# Patient Record
Sex: Male | Born: 1995 | Race: White | Hispanic: No | Marital: Single | Smoking: Current every day smoker
Health system: Southern US, Community
[De-identification: ages and names within clinical notes are randomized; demographics above are authoritative.]

## PROBLEM LIST (undated history)

## (undated) DIAGNOSIS — S83519A Sprain of anterior cruciate ligament of unspecified knee, initial encounter: Secondary | ICD-10-CM

## (undated) DIAGNOSIS — M25562 Pain in left knee: Secondary | ICD-10-CM

## (undated) DIAGNOSIS — J45909 Unspecified asthma, uncomplicated: Secondary | ICD-10-CM

---
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
## 2016-04-03 ENCOUNTER — Encounter (HOSPITAL_COMMUNITY): Payer: Self-pay | Admitting: Emergency Medicine

## 2016-04-03 ENCOUNTER — Ambulatory Visit (HOSPITAL_COMMUNITY)
Admission: EM | Admit: 2016-04-03 | Discharge: 2016-04-03 | Disposition: A | Discharge status: Home / Self Care | Payer: Self-pay | Attending: Emergency Medicine | Admitting: Emergency Medicine

## 2016-04-03 DIAGNOSIS — G44209 Tension-type headache, unspecified, not intractable: Secondary | ICD-10-CM

## 2016-04-03 MED ORDER — CYCLOBENZAPRINE HCL 10 MG PO TABS: 10.0000 mg | ORAL_TABLET | Freq: Every day | ORAL | 0 refills | Status: DC

## 2016-04-03 MED ORDER — NAPROXEN 500 MG PO TABS: 500.0000 mg | ORAL_TABLET | Freq: Two times a day (BID) | ORAL | 0 refills | Status: DC

## 2016-04-03 NOTE — ED Provider Notes (Signed)
CSN: [id number]     Arrival date & time 04/03/16  1007 History   First MD Initiated Contact with Patient 04/03/16 1030     No chief complaint on file.  (Consider location/radiation/quality/duration/timing/severity/associated sxs/prior Treatment) Patient c/o headache bilateral with band restriction qualities.  He states the headache has been present for over 2 weeks now. The headache was worse 5 days ago when he was suffering from viral illness.  He has improved from the viral illness but the headache has been persistent and he had to call out of work today. He has taken  advil on occasion and it has helped some.   The history is provided by the patient.  Headache  Pain location:  Frontal, L parietal, R parietal, L temporal, R temporal and occipital Quality:  Dull Radiates to:  Does not radiate Severity currently:  5/10 Onset quality:  Sudden Duration:  2 weeks Timing:  Constant Progression:  Worsening Chronicity:  New Similar to prior headaches: yes   Context: activity   Relieved by:  NSAIDs Worsened by:  Activity Ineffective treatments:  NSAIDs   Past Medical History:  Diagnosis Date  . Asthma    No past surgical history on file. No family history on file. Social History  Substance Use Topics  . Smoking status: Never Smoker  . Smokeless tobacco: Not on file  . Alcohol use Not on file    Review of Systems  Constitutional: Negative.   Eyes: Negative.   Respiratory: Negative.   Cardiovascular: Negative.   Gastrointestinal: Negative.   Endocrine: Negative.   Musculoskeletal: Negative.   Skin: Negative.   Allergic/Immunologic: Negative.   Neurological: Positive for headaches.  Psychiatric/Behavioral: Negative.     Allergies  Codeine  Home Medications   Prior to Admission medications   Medication Sig Start Date End Date Taking? Authorizing Provider  albuterol (PROVENTIL HFA;VENTOLIN HFA) 108 (90 BASE) MCG/ACT inhaler Inhale 2 puffs into the lungs every 6  (six) hours as needed for wheezing or shortness of breath.    Historical Provider, MD  cetirizine (ZYRTEC) 10 MG tablet Take 1 tablet (10 mg total) by mouth daily. 10/31/14   Charm RingsErin J Honig, MD  cyclobenzaprine (FLEXERIL) 10 MG tablet Take 1 tablet (10 mg total) by mouth at bedtime. 04/03/16   Deatra CanterWilliam J Oxford, FNP  ibuprofen (ADVIL,MOTRIN) 200 MG tablet Take 600 mg by mouth every 6 (six) hours as needed for pain.    Historical Provider, MD  naproxen (NAPROSYN) 500 MG tablet Take 1 tablet (500 mg total) by mouth 2 (two) times daily. 11/15/13   Vanetta MuldersScott Zackowski, MD  naproxen (NAPROSYN) 500 MG tablet Take 1 tablet (500 mg total) by mouth 2 (two) times daily with a meal. 04/03/16   Deatra CanterWilliam J Oxford, FNP  traMADol (ULTRAM) 50 MG tablet Take 1 tablet (50 mg total) by mouth every 6 (six) hours as needed. 11/15/13   Vanetta MuldersScott Zackowski, MD   Meds Ordered and Administered this Visit  Medications - No data to display  There were no vitals taken for this visit. No data found.   Physical Exam  Constitutional: He appears well-developed and well-nourished.  HENT:  Head: Normocephalic and atraumatic.  Right Ear: External ear normal.  Left Ear: External ear normal.  Nose: Nose normal.  Mouth/Throat: Oropharynx is clear and moist.  Eyes: Conjunctivae and EOM are normal. Pupils are equal, round, and reactive to light.  Neck: Normal range of motion. Neck supple.  Pulmonary/Chest: Effort normal and breath sounds normal.  Abdominal: Soft.  Bowel sounds are normal.  Musculoskeletal: He exhibits tenderness.  TTP frontal, occipital, and parietal.  Neck supple no nuchal rigidity.  Nursing note and vitals reviewed.   Urgent Care Course   Clinical Course    Procedures (including critical care time)  Labs Review Labs Reviewed - No data to display  Imaging Review No results found.   Visual Acuity Review  Right Eye Distance:   Left Eye Distance:   Bilateral Distance:    Right Eye Near:   Left Eye Near:     Bilateral Near:         MDM   1. Tension headache    Naprosyn 500mg  one po bid x 10 days #20 Flexeril 10mg  po qhs prn #10    Deatra Canter, FNP 04/03/16 1058

## 2016-04-03 NOTE — ED Triage Notes (Signed)
Patient reports headache and fever for 2 weeks.  Last fever was Saturday.

## 2016-04-04 ENCOUNTER — Encounter (HOSPITAL_COMMUNITY): Payer: Self-pay | Admitting: Emergency Medicine

## 2016-04-04 ENCOUNTER — Ambulatory Visit (HOSPITAL_COMMUNITY)
Admission: EM | Admit: 2016-04-04 | Discharge: 2016-04-04 | Disposition: A | Discharge status: Home / Self Care | Payer: Self-pay | Attending: Internal Medicine | Admitting: Internal Medicine

## 2016-04-04 DIAGNOSIS — S0501XA Injury of conjunctiva and corneal abrasion without foreign body, right eye, initial encounter: Secondary | ICD-10-CM

## 2016-04-04 MED ORDER — POLYMYXIN B-TRIMETHOPRIM 10000-0.1 UNIT/ML-% OP SOLN: 1.0000 [drp] | OPHTHALMIC | 0 refills | Status: DC

## 2016-04-04 MED ORDER — EYE WASH OPHTH SOLN
OPHTHALMIC | Status: AC
  Filled 2016-04-04: qty 118

## 2016-04-04 MED ORDER — TETRACAINE HCL 0.5 % OP SOLN
OPHTHALMIC | Status: AC
  Filled 2016-04-04: qty 2

## 2016-04-04 NOTE — ED Notes (Addendum)
Notified Jay Rasmussenavid Mabe, NP of patient and complaint
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
## 2017-01-15 ENCOUNTER — Emergency Department (HOSPITAL_COMMUNITY)
Admission: EM | Admit: 2017-01-15 | Discharge: 2017-01-15 | Disposition: A | Discharge status: Home / Self Care | Payer: Self-pay | Attending: Emergency Medicine | Admitting: Emergency Medicine

## 2017-01-15 ENCOUNTER — Emergency Department (HOSPITAL_COMMUNITY): Payer: Self-pay

## 2017-01-15 ENCOUNTER — Encounter (HOSPITAL_COMMUNITY): Payer: Self-pay | Admitting: Emergency Medicine

## 2017-01-15 DIAGNOSIS — Y92310 Basketball court as the place of occurrence of the external cause: Secondary | ICD-10-CM | POA: Insufficient documentation

## 2017-01-15 DIAGNOSIS — J45909 Unspecified asthma, uncomplicated: Secondary | ICD-10-CM | POA: Insufficient documentation

## 2017-01-15 DIAGNOSIS — Y9367 Activity, basketball: Secondary | ICD-10-CM | POA: Insufficient documentation

## 2017-01-15 DIAGNOSIS — X501XXA Overexertion from prolonged static or awkward postures, initial encounter: Secondary | ICD-10-CM | POA: Insufficient documentation

## 2017-01-15 DIAGNOSIS — M25462 Effusion, left knee: Secondary | ICD-10-CM | POA: Insufficient documentation

## 2017-01-15 DIAGNOSIS — S8392XA Sprain of unspecified site of left knee, initial encounter: Secondary | ICD-10-CM | POA: Insufficient documentation

## 2017-01-15 DIAGNOSIS — Z79899 Other long term (current) drug therapy: Secondary | ICD-10-CM | POA: Insufficient documentation

## 2017-01-15 DIAGNOSIS — M25562 Pain in left knee: Secondary | ICD-10-CM | POA: Insufficient documentation

## 2017-01-15 DIAGNOSIS — F17201 Nicotine dependence, unspecified, in remission: Secondary | ICD-10-CM | POA: Insufficient documentation

## 2017-01-15 DIAGNOSIS — Y999 Unspecified external cause status: Secondary | ICD-10-CM | POA: Insufficient documentation

## 2017-01-15 MED ORDER — IBUPROFEN 800 MG PO TABS
800.0000 mg | ORAL_TABLET | Freq: Once | ORAL | Status: AC
  Administered 2017-01-15: 800 mg via ORAL
  Filled 2017-01-15: qty 1

## 2017-01-15 NOTE — ED Triage Notes (Signed)
Pt c/o popping sensation in l/knee while playing basketball last night. Tx with ice and tylenol. Able to bear some weight with crutches

## 2017-01-15 NOTE — Discharge Instructions (Signed)
Wear knee immobilizer at all times until you see the orthopedist. You may take it off to shower but don't put any weight through your left leg until you've been cleared by the orthopedist. Use crutches for all weight bearing activities. Ice and elevate knee throughout the day, using ice pack for no more than 20 minutes every hour. Alternate between tylenol and motrin as needed for pain relief. Call orthopedic follow up today or tomorrow to schedule followup appointment for recheck in 5-7 days, and for ongoing evaluation of your knee injury. Return to the ER for changes or worsening symptoms.

## 2017-01-15 NOTE — ED Provider Notes (Signed)
[facility]-EMERGENCY DEPT Provider Note   CSN: [id number] Arrival date & time: 01/15/17  1101  By signing my name below, I, Vista Mink, attest that this documentation has been prepared under the direction and in the presence of [employer].  Electronically Signed: Vista Mink, ED Scribe. 01/15/17. 12:12 PM.  History   Chief Complaint Chief Complaint  Patient presents with  . Knee Pain    l/knee   HPI HPI Comments: Jay Haas is a 21 y.o. male who presents to the Emergency Department complaining of left knee pain s/p an injury that occurred last night around 1930. Pt was playing basketball and believes he may have gotten his left leg tangled between another players, but he is unsure of the exact mechanism of injury, and thinks he may have twisted it, which caused him to fall down onto the side of his knee. Pt describes the pain as 7/10, constant, throbbing, non-radiating, L knee pain that is exacerbated when bearing weight, relieved slightly with ice/elevation/rest, and unrelieved with Tylenol. Associated symptoms include swelling. No prior injuries or surgeries to the knee. He does not see an orthopedist. Pt denies bruising, redness, warmth, numbness, tingling, focal weakness, head inj/LOC, or any other complaints at this time.    The history is provided by the patient and medical records. No language interpreter was used.  Knee Pain   This is a new problem. The current episode started 12 to 24 hours ago. The problem occurs constantly. The problem has been gradually worsening. The pain is present in the left knee. The quality of the pain is described as constant. The pain is at a severity of 7/10. The pain is moderate. Pertinent negatives include no numbness and no tingling. The symptoms are aggravated by standing and activity. He has tried rest, OTC pain medications and cold for the symptoms. The treatment provided mild relief. There has been no history of extremity trauma.     Past Medical History:  Diagnosis Date  . Asthma     There are no active problems to display for this patient.   History reviewed. No pertinent surgical history.     Home Medications    Prior to Admission medications   Medication Sig Start Date End Date Taking? Authorizing Provider  albuterol (PROVENTIL HFA;VENTOLIN HFA) 108 (90 BASE) MCG/ACT inhaler Inhale 2 puffs into the lungs every 6 (six) hours as needed for wheezing or shortness of breath.    [provider]  cetirizine (ZYRTEC) 10 MG tablet Take 1 tablet (10 mg total) by mouth daily. 10/31/14   Charm Rings, MD  cyclobenzaprine (FLEXERIL) 10 MG tablet Take 1 tablet (10 mg total) by mouth at bedtime. 04/03/16   Deatra Canter, FNP  ibuprofen (ADVIL,MOTRIN) 200 MG tablet Take 600 mg by mouth every 6 (six) hours as needed for pain.    [provider]  naproxen (NAPROSYN) 500 MG tablet Take 1 tablet (500 mg total) by mouth 2 (two) times daily. 11/15/13   Vanetta Mulders, MD  naproxen (NAPROSYN) 500 MG tablet Take 1 tablet (500 mg total) by mouth 2 (two) times daily with a meal. 04/03/16   Oxford, Anselm Pancoast, FNP  traMADol (ULTRAM) 50 MG tablet Take 1 tablet (50 mg total) by mouth every 6 (six) hours as needed. 11/15/13   Vanetta Mulders, MD  trimethoprim-polymyxin b (POLYTRIM) ophthalmic solution Place 1 drop into the right eye every 4 (four) hours. For 4 days 04/04/16   Hayden Rasmussen, NP   Family  History Family History  Problem Relation Age of Onset  . Hypertension Mother   . Hypertension Father     Social History Social History  Substance Use Topics  . Smoking status: Current Every Day Smoker    Packs/day: 0.50    Types: Cigarettes  . Smokeless tobacco: Never Used  . Alcohol use Yes     Comment: occ   Allergies   Codeine  Review of Systems Review of Systems  HENT: Negative for facial swelling (no head inj).   Musculoskeletal: Positive for arthralgias (left knee) and joint swelling (left knee).  Negative for myalgias.  Skin: Negative for color change.  Allergic/Immunologic: Negative for immunocompromised state.  Neurological: Negative for tingling, syncope, weakness and numbness.  Psychiatric/Behavioral: Negative for confusion.    Physical Exam Updated Vital Signs BP (!) 136/92 (BP Location: Right Arm)   Pulse 60   Temp 98.1 F (36.7 C) (Oral)   Resp 18   Ht 5\' 8"  (1.727 m)   Wt 215 lb (97.5 kg)   SpO2 99%   BMI 32.69 kg/m   Physical Exam  Constitutional: He is oriented to person, place, and time. Vital signs are normal. He appears well-developed and well-nourished.  Non-toxic appearance. No distress.  Afebrile, nontoxic, NAD  HENT:  Head: Normocephalic and atraumatic.  Mouth/Throat: Mucous membranes are normal.  Eyes: Conjunctivae and EOM are normal. Right eye exhibits no discharge. Left eye exhibits no discharge.  Neck: Normal range of motion. Neck supple.  Cardiovascular: Normal rate and intact distal pulses.   Pulmonary/Chest: Effort normal. No respiratory distress.  Abdominal: Normal appearance. He exhibits no distension.  Musculoskeletal: Normal range of motion.       Left knee: He exhibits swelling and effusion. He exhibits normal range of motion, no ecchymosis, no deformity, no laceration, no erythema, normal alignment, no LCL laxity, normal patellar mobility and no MCL laxity. Tenderness found. Medial joint line and lateral joint line tenderness noted.  Left knee with FROM intact, with diffuse jointline TTP, +swelling/effusion, no deformity, no bruising or erythema, no warmth, no abnormal alignment or patellar mobility, no varus/valgus laxity, neg anterior drawer test, no crepitus. No baker's cyst palpable however significant tenderness to popliteal fossa. Strength and sensation grossly intact, distal pulses intact, compartments soft. Unable to bear much weight through the L leg, can only put ~5-10% through that leg  Neurological: He is alert and oriented to  person, place, and time. He has normal strength. No sensory deficit.  Skin: Skin is warm, dry and intact. No rash noted.  Psychiatric: He has a normal mood and affect.  Nursing note and vitals reviewed.  ED Treatments / Results  DIAGNOSTIC STUDIES: Oxygen Saturation is 99% on RA, normal by my interpretation.  COORDINATION OF CARE: 12:05 PM-Discussed treatment plan with pt at bedside and pt agreed to plan.   Labs (all labs ordered are listed, but only abnormal results are displayed) Labs Reviewed - No data to display  EKG  EKG Interpretation None       Radiology Dg Knee Complete 4 Views Left  Result Date: 01/15/2017 CLINICAL DATA:  Injured playing basketball yesterday with persistent pain, initial encounter EXAM: LEFT KNEE - COMPLETE 4+ VIEW COMPARISON:  None. FINDINGS: No acute fracture or dislocation is seen. Moderate joint effusion is noted. No other soft tissue abnormality is seen. IMPRESSION: Moderate joint effusion without acute bony abnormality. Electronically Signed   By: Alcide CleverMark  Lukens M.D.   On: 01/15/2017 12:07    Procedures Procedures (including critical  care time)  Medications Ordered in ED Medications  ibuprofen (ADVIL,MOTRIN) tablet 800 mg (800 mg Oral Given 01/15/17 1217)     Initial Impression / Assessment and Plan / ED Course  I have reviewed the triage vital signs and the nursing notes.  Pertinent labs & imaging results that were available during my care of the patient were reviewed by me and considered in my medical decision making (see chart for details).     21 y.o. male here with L knee pain/swelling after twisting it during basketball yesterday and having difficulty bearing weight since then. On exam, moderate swelling/effusion, no bruising/erythema/warmth, diffuse jointline TTP and to popliteal fossa, unable to put more than ~5-10% of weight through the leg. Xray ordered, will await results, however if negative, may consider CT knee given possible  concern for tibial plateau fx. Will give ibuprofen at pt's request, then reassess shortly.   12:26 PM Xray negative however showing moderate joint effusion. Discussed with pt that we could either get CT to confirm or r/o occult tibial plateau fx, or we could treat conservatively and have him f/up with ortho who would most likely end up getting MRI of the knee anyway, which would show any occult fx AND meniscal/ligamentous injuries. Discussed r/b/a of both options, and pt ultimately decided he'd rather know for certain if there's an occult osseous injury, so he opted for CT today. Will obtain this, and reassess after.   2:05 PM Pt doesn't want to wait any longer for CT scan, and would prefer now to just leave and f/up with ortho. I think this is reasonable considering he's NVI although discussed that there may be an occult tibial plateau fx that we haven't diagnosed. Will place in knee immobilizer and give crutches, advised nonweight bearing until he sees the orthopedist, will have him f/up with ortho in 5-7 days for ongoing evaluation and management of his injury. Discussed RICE, tylenol/motrin for pain.  I explained the diagnosis and have given explicit precautions to return to the ER including for any other new or worsening symptoms. The patient understands and accepts the medical plan as it's been dictated and I have answered their questions. Discharge instructions concerning home care and prescriptions have been given. The patient is STABLE and is discharged to home in good condition.   I personally performed the services described in this documentation, which was scribed in my presence. The recorded information has been reviewed and is accurate.    Final Clinical Impressions(s) / ED Diagnoses   Final diagnoses:  Acute pain of left knee  Sprain of left knee, unspecified ligament, initial encounter  Effusion of left knee    New Prescriptions New Prescriptions   No medications on [address] [place], [place] 01/15/17 1406    Arby Barrette, MD 01/16/17 1622

## 2017-01-20 ENCOUNTER — Encounter (INDEPENDENT_AMBULATORY_CARE_PROVIDER_SITE_OTHER): Payer: Self-pay | Admitting: Orthopaedic Surgery

## 2017-01-20 ENCOUNTER — Ambulatory Visit (INDEPENDENT_AMBULATORY_CARE_PROVIDER_SITE_OTHER): Payer: Self-pay | Admitting: Orthopaedic Surgery

## 2017-01-20 DIAGNOSIS — M1612 Unilateral primary osteoarthritis, left hip: Secondary | ICD-10-CM

## 2017-01-20 DIAGNOSIS — M25562 Pain in left knee: Secondary | ICD-10-CM

## 2017-01-20 NOTE — Progress Notes (Signed)
   Office Visit Note   Patient: Jay Haas           Date of Birth: Apr 19, 1996           MRN: [id number] Visit Date: 01/20/2017              Requested by: No referring provider defined for this encounter. PCP: Patient, No Pcp Per   Assessment & Plan: Visit Diagnoses:  1. Acute pain of left knee   2. Primary osteoarthritis of left hip     Plan: MRI of the left knee ordered to evaluate for large effusion, occult fracture and internal derangement.  Follow-Up Instructions: Return in about 10 days (around 01/30/2017).   Orders:  Orders Placed This Encounter  Procedures  . MR Knee Left w/o contrast   No orders of the defined types were placed in this encounter.     Procedures: No procedures performed   Clinical Data: No additional findings.   Subjective: Chief Complaint  Patient presents with  . Left Knee - Injury    Jay Haas is a 21 year old healthy gentleman who sustained a left knee injury while playing basketball 6 days ago. He felt something tear in his knee and immediately fell to the floor. He now has swelling in his knee. He was evaluated in the ER and x-rays were normal except for an effusion. He has been ambulating in a knee immobilizer. He denies any radiation of pain.    Review of Systems  Constitutional: Negative.   All other systems reviewed and are negative.    Objective: Vital Signs: There were no vitals taken for this visit.  Physical Exam  Constitutional: He is oriented to person, place, and time. He appears well-developed and well-nourished.  HENT:  Head: Normocephalic and atraumatic.  Eyes: Pupils are equal, round, and reactive to light.  Neck: Neck supple.  Pulmonary/Chest: Effort normal.  Abdominal: Soft.  Musculoskeletal: Normal range of motion.  Neurological: He is alert and oriented to person, place, and time.  Skin: Skin is warm.  Psychiatric: He has a normal mood and affect. His behavior is normal. Judgment and thought content  normal.  Nursing note and vitals reviewed.   Ortho Exam Left knee exam shows a moderate effusion. There is no warmth or cellulitis. Range of motion is appropriate. Collaterals and cruciates are stable. Patellar tracking is normal. He does have significant lateral joint line tenderness and lateral tibial plateau pain with palpation. Specialty Comments:  No specialty comments available.  Imaging: No results found.   PMFS History: There are no active problems to display for this patient.  Past Medical History:  Diagnosis Date  . Asthma     Family History  Problem Relation Age of Onset  . Hypertension Mother   . Hypertension Father     No past surgical history on file. Social History   Occupational History  . Not on file.   Social History Main Topics  . Smoking status: Current Every Day Smoker    Packs/day: 0.50    Types: Cigarettes  . Smokeless tobacco: Never Used  . Alcohol use Yes     Comment: occ  . Drug use: Yes    Types: Marijuana  . Sexual activity: Not on file

## 2017-01-30 ENCOUNTER — Ambulatory Visit (INDEPENDENT_AMBULATORY_CARE_PROVIDER_SITE_OTHER): Payer: Self-pay | Admitting: Orthopaedic Surgery

## 2017-02-03 ENCOUNTER — Ambulatory Visit
Admission: RE | Admit: 2017-02-03 | Discharge: 2017-02-03 | Disposition: A | Discharge status: Home / Self Care | Payer: No Typology Code available for payment source | Source: Ambulatory Visit | Attending: Orthopaedic Surgery | Admitting: Orthopaedic Surgery

## 2017-02-03 DIAGNOSIS — M25562 Pain in left knee: Secondary | ICD-10-CM

## 2017-02-07 ENCOUNTER — Ambulatory Visit (INDEPENDENT_AMBULATORY_CARE_PROVIDER_SITE_OTHER): Payer: Self-pay | Admitting: Orthopaedic Surgery

## 2017-02-07 ENCOUNTER — Other Ambulatory Visit (INDEPENDENT_AMBULATORY_CARE_PROVIDER_SITE_OTHER): Payer: Self-pay

## 2017-02-07 DIAGNOSIS — S83512A Sprain of anterior cruciate ligament of left knee, initial encounter: Secondary | ICD-10-CM

## 2017-02-07 DIAGNOSIS — S83512D Sprain of anterior cruciate ligament of left knee, subsequent encounter: Secondary | ICD-10-CM

## 2017-02-07 NOTE — Progress Notes (Signed)
   Office Visit Note   Patient: Jay Haas           Date of Birth: 1995/08/28           MRN: [id number] Visit Date: 02/07/2017              Requested by: No referring provider defined for this encounter. PCP: Patient, No Pcp Per   Assessment & Plan: Visit Diagnoses:  1. New ACL tear, left, initial encounter     Plan: MRI findings were reviewed with the patient. Patient like to attempt conservative treatment with functional anterior cruciate ligament brace and physical therapy. I'll like to put him on restrictions at work for 6 weeks. Questions encouraged and answered. Follow-up with me as needed.  Follow-Up Instructions: Return if symptoms worsen or fail to improve.   Orders:  No orders of the defined types were placed in this encounter.  No orders of the defined types were placed in this encounter.     Procedures: No procedures performed   Clinical Data: No additional findings.   Subjective: No chief complaint on file.   Jay Haas comes in today to review his MRI. His knee feels slightly better. His injury was about 3 weeks ago. He does still have a little persistent swelling.    Review of Systems  Constitutional: Negative.   All other systems reviewed and are negative.    Objective: Vital Signs: There were no vitals taken for this visit.  Physical Exam  Constitutional: He is oriented to person, place, and time. He appears well-developed and well-nourished.  Pulmonary/Chest: Effort normal.  Abdominal: Soft.  Neurological: He is alert and oriented to person, place, and time.  Skin: Skin is warm.  Psychiatric: He has a normal mood and affect. His behavior is normal. Judgment and thought content normal.  Nursing note and vitals reviewed.   Ortho Exam Left knee exam shows a trace effusion. Lachman and anterior drawer are both positive. Negative pivot shift. Specialty Comments:  No specialty comments available.  Imaging: No results found.   PMFS  History: Patient Active Problem List   Diagnosis Date Noted  . New ACL tear, left, initial encounter 02/07/2017   Past Medical History:  Diagnosis Date  . Asthma     Family History  Problem Relation Age of Onset  . Hypertension Mother   . Hypertension Father     No past surgical history on file. Social History   Occupational History  . Not on file.   Social History Main Topics  . Smoking status: Current Every Day Smoker    Packs/day: 0.50    Types: Cigarettes  . Smokeless tobacco: Never Used  . Alcohol use Yes     Comment: occ  . Drug use: Yes    Types: Marijuana  . Sexual activity: Not on file

## 2017-07-22 ENCOUNTER — Emergency Department (HOSPITAL_COMMUNITY)
Admission: EM | Admit: 2017-07-22 | Discharge: 2017-07-22 | Disposition: A | Discharge status: Home / Self Care | Payer: Self-pay | Attending: Emergency Medicine | Admitting: Emergency Medicine

## 2017-07-22 ENCOUNTER — Other Ambulatory Visit: Payer: Self-pay

## 2017-07-22 ENCOUNTER — Encounter (HOSPITAL_COMMUNITY): Payer: Self-pay | Admitting: Emergency Medicine

## 2017-07-22 ENCOUNTER — Emergency Department (HOSPITAL_COMMUNITY): Payer: Self-pay

## 2017-07-22 DIAGNOSIS — F17210 Nicotine dependence, cigarettes, uncomplicated: Secondary | ICD-10-CM | POA: Insufficient documentation

## 2017-07-22 DIAGNOSIS — Y929 Unspecified place or not applicable: Secondary | ICD-10-CM | POA: Insufficient documentation

## 2017-07-22 DIAGNOSIS — J45909 Unspecified asthma, uncomplicated: Secondary | ICD-10-CM | POA: Insufficient documentation

## 2017-07-22 DIAGNOSIS — W1842XA Slipping, tripping and stumbling without falling due to stepping into hole or opening, initial encounter: Secondary | ICD-10-CM | POA: Insufficient documentation

## 2017-07-22 DIAGNOSIS — Y9389 Activity, other specified: Secondary | ICD-10-CM | POA: Insufficient documentation

## 2017-07-22 DIAGNOSIS — Y998 Other external cause status: Secondary | ICD-10-CM | POA: Insufficient documentation

## 2017-07-22 DIAGNOSIS — S8392XA Sprain of unspecified site of left knee, initial encounter: Secondary | ICD-10-CM | POA: Insufficient documentation

## 2017-07-22 DIAGNOSIS — Z79899 Other long term (current) drug therapy: Secondary | ICD-10-CM | POA: Insufficient documentation

## 2017-07-22 HISTORY — DX: Sprain of anterior cruciate ligament of unspecified knee, initial encounter: S83.519A

## 2017-07-22 HISTORY — DX: Pain in left knee: M25.562

## 2017-07-22 MED ORDER — HYDROCODONE-ACETAMINOPHEN 5-325 MG PO TABS: ORAL_TABLET | ORAL | 0 refills | Status: DC

## 2017-07-22 NOTE — Discharge Instructions (Signed)
Keep the knee immobilizer on at all times including when sleeping except when bathing. Do not stop using the knee immobilizer until you are cleared by your orthopedist.  For pain control please take ibuprofen (also known as Motrin or Advil) 800mg  (this is normally 4 over the counter pills) 3 times a day  for 5 days. Take with food to minimize stomach irritation.  Take vicodin for breakthrough pain, do not drink alcohol, drive, care for children or do other critical tasks while taking vicodin.  Please follow with your primary care doctor in the next 2 days for a check-up. They must obtain records for further management.   Do not hesitate to return to the Emergency Department for any new, worsening or concerning symptoms.

## 2017-07-22 NOTE — ED Provider Notes (Signed)
[facility]-EMERGENCY DEPT Provider Note   CSN: [id number] Arrival date & time: 07/22/17  1022     History   Chief Complaint Chief Complaint  Patient presents with  . Knee Pain     HPI   Blood pressure (!) 160/89, pulse 78, temperature 98 F (36.7 C), resp. rate 16, weight 102.1 kg (225 lb), SpO2 99 %.  Jay Haas is a 21 y.o. male complaining of severe left knee pain after stepping in a hole while walking and twisting it yesterday.  He had a partial ACL tear of the same knee approximately 6 months ago.  He has been using his crutches, he has a knee immobilizer at home.  He has not been using the immobilizer.  Has been taking ibuprofen at home with little relief.  The pain is severe and exacerbated by movement, palpation, extension, weightbearing.  Past Medical History:  Diagnosis Date  . ACL (anterior cruciate ligament) tear   . Asthma   . Knee pain, left    ACL tear    Patient Active Problem List   Diagnosis Date Noted  . New ACL tear, left, initial encounter 02/07/2017    History reviewed. No pertinent surgical history.     Home Medications    Prior to Admission medications   Medication Sig Start Date End Date Taking? Authorizing Provider  albuterol (PROVENTIL HFA;VENTOLIN HFA) 108 (90 BASE) MCG/ACT inhaler Inhale 2 puffs into the lungs every 6 (six) hours as needed for wheezing or shortness of breath.    [provider]  cetirizine (ZYRTEC) 10 MG tablet Take 1 tablet (10 mg total) by mouth daily. Patient not taking: Reported on 01/15/2017 10/31/14   Charm RingsHonig, Erin J, MD  cyclobenzaprine (FLEXERIL) 10 MG tablet Take 1 tablet (10 mg total) by mouth at bedtime. Patient not taking: Reported on 01/15/2017 04/03/16   Deatra Canterxford, William J, FNP  fexofenadine (ALLEGRA) 180 MG tablet Take 180 mg by mouth daily.    [provider]  HYDROcodone-acetaminophen (NORCO/VICODIN) 5-325 MG tablet Take 1-2 tablets by mouth every 6 hours as needed  for pain. 07/22/17   Kimie Pidcock, Joni ReiningNicole, PA-C  ibuprofen (ADVIL,MOTRIN) 200 MG tablet Take 600 mg by mouth every 6 (six) hours as needed for pain.    [provider]  naproxen (NAPROSYN) 500 MG tablet Take 1 tablet (500 mg total) by mouth 2 (two) times daily. Patient not taking: Reported on 01/15/2017 11/15/13   Vanetta MuldersZackowski, Scott, MD  naproxen (NAPROSYN) 500 MG tablet Take 1 tablet (500 mg total) by mouth 2 (two) times daily with a meal. Patient not taking: Reported on 01/15/2017 04/03/16   Deatra Canterxford, William J, FNP  traMADol (ULTRAM) 50 MG tablet Take 1 tablet (50 mg total) by mouth every 6 (six) hours as needed. Patient not taking: Reported on 01/15/2017 11/15/13   Vanetta MuldersZackowski, Scott, MD  trimethoprim-polymyxin b (POLYTRIM) ophthalmic solution Place 1 drop into the right eye every 4 (four) hours. For 4 days Patient not taking: Reported on 01/15/2017 04/04/16   Hayden RasmussenMabe, David, NP    Family History Family History  Problem Relation Age of Onset  . Hypertension Mother   . Hypertension Father     Social History Social History   Tobacco Use  . Smoking status: Current Every Day Smoker    Packs/day: 0.50    Types: Cigarettes  . Smokeless tobacco: Never Used  Substance Use Topics  . Alcohol use: Yes    Comment: occ  . Drug use: Yes  Types: Marijuana     Allergies   Codeine   Review of Systems Review of Systems  A complete review of systems was obtained and all systems are negative except as noted in the HPI and PMH. =  Physical Exam Updated Vital Signs BP (!) 160/89   Pulse 78   Temp 98 F (36.7 C)   Resp 16   Wt 102.1 kg (225 lb)   SpO2 99%   BMI 34.21 kg/m   Physical Exam  Constitutional: He is oriented to person, place, and time. He appears well-developed and well-nourished. No distress.  HENT:  Head: Normocephalic.  Eyes: Conjunctivae and EOM are normal.  Cardiovascular: Normal rate.  Pulmonary/Chest: Effort normal. No stridor.  Musculoskeletal:  Left knee with  no overlying erythema, laceration or abrasions.  Moderate effusion, significantly reduced range of motion in extension.  Grossly stable to valgus and varus stress, anterior and posterior drawer grossly stable.  Distally neurovascularly intact.  Neurological: He is alert and oriented to person, place, and time.  Psychiatric: He has a normal mood and affect.  Nursing note and vitals reviewed.    ED Treatments / Results  Labs (all labs ordered are listed, but only abnormal results are displayed) Labs Reviewed - No data to display  EKG  EKG Interpretation None       Radiology Dg Knee Complete 4 Views Left  Result Date: 07/22/2017 CLINICAL DATA:  21 year old male with history of knee injury known prior ACL injury EXAM: LEFT KNEE - COMPLETE 4+ VIEW COMPARISON:  MR 02/03/2017, plain film 01/15/2017 FINDINGS: No acute displaced fracture identified. No significant degenerative changes. No radiopaque foreign body. Lateral view demonstrates joint effusion. IMPRESSION: Left knee joint effusion with no acute bony abnormality. Electronically Signed   By: Gilmer Mor D.O.   On: 07/22/2017 11:53    Procedures Procedures (including critical care time)  Medications Ordered in ED Medications - No data to display   Initial Impression / Assessment and Plan / ED Course  I have reviewed the triage vital signs and the nursing notes.  Pertinent labs & imaging results that were available during my care of the patient were reviewed by me and considered in my medical decision making (see chart for details).     Vitals:   07/22/17 1036 07/22/17 1113 07/22/17 1219  BP: (!) 160/89    Pulse: 90  78  Resp: 16    Temp: 98 F (36.7 C)    SpO2: 97%  99%  Weight:  102.1 kg (225 lb)     Medications - No data to display  Jay Haas is 21 y.o. male presenting with left knee pain and swelling after stepping into a hole and twisting it yesterday.  He has had partial ACL tear on this knee  approximately 6 months ago.  Grossly stable on my exam however, there is significant inflammation and effusion.  He has some difficulty with full extension of the knee.  X-ray negative, I have advised him to use his knee immobilizer at all times, he has crutches sized appropriately.  I advised high-dose ibuprofen and Vicodin with close follow-up with orthopedist, patient is written out of work.  He declines pain medication in the ED.  Evaluation does not show pathology that would require ongoing emergent intervention or inpatient treatment. Pt is hemodynamically stable and mentating appropriately. Discussed findings and plan with patient/guardian, who agrees with care plan. All questions answered. Return precautions discussed and outpatient follow up given.  Final Clinical Impressions(s) / ED Diagnoses   Final diagnoses:  Sprain of left knee, unspecified ligament, initial encounter    ED Discharge Orders        Ordered    HYDROcodone-acetaminophen (NORCO/VICODIN) 5-325 MG tablet     07/22/17 1212       Dariel Pellecchia, Mardella Laymanicole, PA-C 07/22/17 1255    Mancel BaleWentz, Elliott, MD 07/22/17 [phone]

## 2017-07-22 NOTE — ED Triage Notes (Signed)
Pt stated the he re injured his l/knee.Pt twisted l/knee last night.Stated that he  heard a loud popping sound. Pt has hx of l/knee injury 6 months ago.

## 2017-07-31 ENCOUNTER — Ambulatory Visit (INDEPENDENT_AMBULATORY_CARE_PROVIDER_SITE_OTHER): Payer: Self-pay | Admitting: Orthopaedic Surgery

## 2017-08-04 ENCOUNTER — Encounter (INDEPENDENT_AMBULATORY_CARE_PROVIDER_SITE_OTHER): Payer: Self-pay | Admitting: Orthopaedic Surgery

## 2017-08-04 ENCOUNTER — Ambulatory Visit (INDEPENDENT_AMBULATORY_CARE_PROVIDER_SITE_OTHER): Payer: Self-pay | Admitting: Orthopaedic Surgery

## 2017-08-04 DIAGNOSIS — S83512A Sprain of anterior cruciate ligament of left knee, initial encounter: Secondary | ICD-10-CM

## 2017-08-04 MED ORDER — METHYLPREDNISOLONE ACETATE 40 MG/ML IJ SUSP
40.0000 mg | INTRAMUSCULAR | Status: AC | PRN
  Administered 2017-08-04: 40 mg via INTRA_ARTICULAR

## 2017-08-04 MED ORDER — LIDOCAINE HCL 1 % IJ SOLN
2.0000 mL | INTRAMUSCULAR | Status: AC | PRN
  Administered 2017-08-04: 2 mL

## 2017-08-04 MED ORDER — BUPIVACAINE HCL 0.5 % IJ SOLN
2.0000 mL | INTRAMUSCULAR | Status: AC | PRN
  Administered 2017-08-04: 2 mL via INTRA_ARTICULAR

## 2017-08-04 NOTE — Progress Notes (Signed)
   Office Visit Note   Patient: Jay Haas           Date of Birth: 05-10-1996           MRN: [id number] Visit Date: 08/04/2017              Requested by: No referring provider defined for this encounter. PCP: Patient, No Pcp Per   Assessment & Plan: Visit Diagnoses:  1. New ACL tear, left, initial encounter     Plan: Impression is 21 year old gentleman with recurrent left knee effusion status post known ACL tear.  Aspiration and injection were performed today.  Patient understands this may be a recurrent issue due to his ACL tear.  He understands that recommendation is for ACL reconstruction but patient currently is uninsured and is unable to afford surgery.  Follow-Up Instructions: Return if symptoms worsen or fail to improve.   Orders:  No orders of the defined types were placed in this encounter.  No orders of the defined types were placed in this encounter.     Procedures: Large Joint Inj: L knee on 08/04/2017 5:54 PM Details: 22 G needle Medications: 2 mL bupivacaine 0.5 %; 2 mL lidocaine 1 %; 40 mg methylPREDNISolone acetate 40 MG/ML Aspirate: blood-tinged Outcome: tolerated well, no immediate complications Patient was prepped and draped in the usual sterile fashion.       Clinical Data: No additional findings.   Subjective: Chief Complaint  Patient presents with  . Left Knee - Pain    Patient follows up today for recent left knee pain and swelling.  He twisted his knee recently and had development of swelling.  His pain is mild to moderate.  He has trouble extending his knee.  He did not feel a pop.    Review of Systems  Constitutional: Negative.   All other systems reviewed and are negative.    Objective: Vital Signs: There were no vitals taken for this visit.  Physical Exam  Constitutional: He is oriented to person, place, and time. He appears well-developed and well-nourished.  Pulmonary/Chest: Effort normal.  Abdominal: Soft.    Neurological: He is alert and oriented to person, place, and time.  Skin: Skin is warm.  Psychiatric: He has a normal mood and affect. His behavior is normal. Judgment and thought content normal.  Nursing note and vitals reviewed.   Ortho Exam Left knee exam shows a large effusion.  Otherwise exam is stable Specialty Comments:  No specialty comments available.  Imaging: No results found.   PMFS History: Patient Active Problem List   Diagnosis Date Noted  . New ACL tear, left, initial encounter 02/07/2017   Past Medical History:  Diagnosis Date  . ACL (anterior cruciate ligament) tear   . Asthma   . Knee pain, left    ACL tear    Family History  Problem Relation Age of Onset  . Hypertension Mother   . Hypertension Father     History reviewed. No pertinent surgical history. Social History   Occupational History  . Not on file  Tobacco Use  . Smoking status: Current Every Day Smoker    Packs/day: 0.50    Types: Cigarettes  . Smokeless tobacco: Never Used  Substance and Sexual Activity  . Alcohol use: Yes    Comment: occ  . Drug use: Yes    Types: Marijuana  . Sexual activity: Not on file

## 2017-12-19 ENCOUNTER — Telehealth (INDEPENDENT_AMBULATORY_CARE_PROVIDER_SITE_OTHER): Payer: Self-pay | Admitting: Orthopaedic Surgery

## 2017-12-19 NOTE — Telephone Encounter (Signed)
Mailed medical records request form to patient

## 2018-10-29 ENCOUNTER — Other Ambulatory Visit: Payer: Self-pay

## 2018-10-29 ENCOUNTER — Encounter (HOSPITAL_COMMUNITY): Payer: Self-pay | Admitting: Emergency Medicine

## 2018-10-29 ENCOUNTER — Emergency Department (HOSPITAL_COMMUNITY)
Admission: EM | Admit: 2018-10-29 | Discharge: 2018-10-29 | Disposition: A | Discharge status: Home / Self Care | Payer: Self-pay | Attending: Emergency Medicine | Admitting: Emergency Medicine

## 2018-10-29 DIAGNOSIS — F17210 Nicotine dependence, cigarettes, uncomplicated: Secondary | ICD-10-CM | POA: Insufficient documentation

## 2018-10-29 DIAGNOSIS — Z79899 Other long term (current) drug therapy: Secondary | ICD-10-CM | POA: Insufficient documentation

## 2018-10-29 DIAGNOSIS — J45909 Unspecified asthma, uncomplicated: Secondary | ICD-10-CM | POA: Insufficient documentation

## 2018-10-29 DIAGNOSIS — B370 Candidal stomatitis: Secondary | ICD-10-CM | POA: Insufficient documentation

## 2018-10-29 LAB — CBG MONITORING, ED: Glucose-Capillary: 113 mg/dL — ABNORMAL HIGH (ref 70–99)

## 2018-10-29 MED ORDER — NYSTATIN 100000 UNIT/ML MT SUSP: 500000.0000 [IU] | Freq: Four times a day (QID) | OROMUCOSAL | 0 refills | Status: AC

## 2018-10-29 NOTE — ED Triage Notes (Signed)
Pt reports white splotches and soreness in his throat x 2 days,

## 2018-10-29 NOTE — ED Notes (Signed)
Discharge instructions and prescription discussed with Pt. Pt verbalized understanding. Pt stable and ambulatory.    

## 2018-10-29 NOTE — ED Provider Notes (Signed)
[facility] EMERGENCY DEPARTMENT Provider Note   CSN: [id number] Arrival date & time: 10/29/18  2147    History   Chief Complaint Chief Complaint  Patient presents with  . Sore Throat    HPI Jay Haas is a 23 y.o. male.     23 y/o male with no PMH presents to the emergency department with a sore throat x2 days.  He reports feeling a tickle in his throat when he looked at his throat tonight and states seeing white patches all along the middle.  He also reports a dry cough, reports not much of a URI symptoms lately.  No alleviating or exacerbating factors.  He reports no sick contacts.  Patient denies any children in the home.  He denies any difficulty swallowing, fever, shortness of breath.     Past Medical History:  Diagnosis Date  . ACL (anterior cruciate ligament) tear   . Asthma   . Knee pain, left    ACL tear    Patient Active Problem List   Diagnosis Date Noted  . New ACL tear, left, initial encounter 02/07/2017    History reviewed. No pertinent surgical history.      Home Medications    Prior to Admission medications   Medication Sig Start Date End Date Taking? Authorizing Provider  albuterol (PROVENTIL HFA;VENTOLIN HFA) 108 (90 BASE) MCG/ACT inhaler Inhale 2 puffs into the lungs every 6 (six) hours as needed for wheezing or shortness of breath.    [provider]  cetirizine (ZYRTEC) 10 MG tablet Take 1 tablet (10 mg total) by mouth daily. Patient not taking: Reported on 01/15/2017 10/31/14   Charm Rings, MD  cyclobenzaprine (FLEXERIL) 10 MG tablet Take 1 tablet (10 mg total) by mouth at bedtime. Patient not taking: Reported on 01/15/2017 04/03/16   Deatra Canter, FNP  fexofenadine (ALLEGRA) 180 MG tablet Take 180 mg by mouth daily.    [provider]  HYDROcodone-acetaminophen (NORCO/VICODIN) 5-325 MG tablet Take 1-2 tablets by mouth every 6 hours as needed for pain. 07/22/17   Pisciotta, Joni Reining, PA-C  ibuprofen  (ADVIL,MOTRIN) 200 MG tablet Take 600 mg by mouth every 6 (six) hours as needed for pain.    [provider]  naproxen (NAPROSYN) 500 MG tablet Take 1 tablet (500 mg total) by mouth 2 (two) times daily. Patient not taking: Reported on 01/15/2017 11/15/13   Vanetta Mulders, MD  naproxen (NAPROSYN) 500 MG tablet Take 1 tablet (500 mg total) by mouth 2 (two) times daily with a meal. Patient not taking: Reported on 01/15/2017 04/03/16   Deatra Canter, FNP  nystatin (MYCOSTATIN) 100000 UNIT/ML suspension Take 5 mLs (500,000 Units total) by mouth 4 (four) times daily for 5 days. retained in mouth as long as possible prior to swallowing. 10/29/18 11/03/18  Claude Manges, PA-C  traMADol (ULTRAM) 50 MG tablet Take 1 tablet (50 mg total) by mouth every 6 (six) hours as needed. Patient not taking: Reported on 01/15/2017 11/15/13   Vanetta Mulders, MD  trimethoprim-polymyxin b (POLYTRIM) ophthalmic solution Place 1 drop into the right eye every 4 (four) hours. For 4 days Patient not taking: Reported on 01/15/2017 04/04/16   Hayden Rasmussen, NP    Family History Family History  Problem Relation Age of Onset  . Hypertension Mother   . Hypertension Father     Social History Social History   Tobacco Use  . Smoking status: Current Every Day Smoker    Packs/day: 0.50  Types: Cigarettes  . Smokeless tobacco: Never Used  Substance Use Topics  . Alcohol use: Yes    Comment: occ  . Drug use: Yes    Types: Marijuana     Allergies   Codeine   Review of Systems Review of Systems  Constitutional: Negative for fever.  HENT: Positive for sore throat.      Physical Exam Updated Vital Signs BP (!) 157/95   Pulse 85   Temp 98.1 F (36.7 C) (Oral)   Resp 16   SpO2 99%   Physical Exam Vitals signs and nursing note reviewed.  Constitutional:      Appearance: He is well-developed.     Comments: Well-appearing.  HENT:     Head: Normocephalic and atraumatic.     Mouth/Throat:     Mouth:  Mucous membranes are moist.     Pharynx: Posterior oropharyngeal erythema present. No oropharyngeal exudate.     Comments: White patches located along the uvula and palate region.  No tonsillar exudate, tonsillar abscess noted. Eyes:     General: No scleral icterus.    Pupils: Pupils are equal, round, and reactive to light.  Neck:     Musculoskeletal: Normal range of motion.  Cardiovascular:     Heart sounds: Normal heart sounds.  Pulmonary:     Effort: Pulmonary effort is normal.     Breath sounds: Normal breath sounds. No wheezing.     Comments: Lungs are clear to auscultation, no wheezing or rales noted. Chest:     Chest wall: No tenderness.  Abdominal:     General: Bowel sounds are normal. There is no distension.     Palpations: Abdomen is soft.     Tenderness: There is no abdominal tenderness.  Musculoskeletal:        General: No tenderness or deformity.  Skin:    General: Skin is warm and dry.  Neurological:     Mental Status: He is alert and oriented to person, place, and time.      ED Treatments / Results  Labs (all labs ordered are listed, but only abnormal results are displayed) Labs Reviewed  CBG MONITORING, ED - Abnormal; Notable for the following components:      Result Value   Glucose-Capillary 113 (*)    All other components within normal limits    EKG None  Radiology No results found.  Procedures Procedures (including critical care time)  Medications Ordered in ED Medications - No data to display   Initial Impression / Assessment and Plan / ED Course  I have reviewed the triage vital signs and the nursing notes.  Pertinent labs & imaging results that were available during my care of the patient were reviewed by me and considered in my medical decision making (see chart for details).      Patient presents with chief complaint of sore throat, no past medical history aside from asthma.  Reports no increase using his inhaler recently.  Denies  any URI symptoms aside from sore throat.  No congestion or sick contacts in the home.  During evaluation there are oral patches along the uvula midline and palate, no tonsillar exudates or tonsillar abscess noted.  He denies any fever, Centor criteria is negative, low suspicion for strep pharyngitis as patient does not have any of the other listed symptoms.  Vital signs are stable during visit, denies any shortness of breath at this time.  No visual tonsillar abscess during evaluation.  No difficulty tolerating his secretions or  swallowing.  Oral patches are consistent likely with oral candidiasis, he is currently not immunocompromised per patient.  Denies any previous history of HIV, diabetes at this time.  CBG was within normal limits.  Will give patient a prescription for nystatin, instructions have been provided. Patient instructed to follow-up with PCP as needed.  Patient understands and agrees with management.  Return precautions provided at length.   Final Clinical Impressions(s) / ED Diagnoses   Final diagnoses:  Oral candidiasis    ED Discharge Orders         Ordered    nystatin (MYCOSTATIN) 100000 UNIT/ML suspension  4 times daily     10/29/18 2209           Claude Manges, Cordelia Poche 10/29/18 2218    Mesner, Barbara Cower, MD 11/02/18 [phone]

## 2018-10-29 NOTE — Discharge Instructions (Addendum)
I have prescribed medication to help with your sore throat, please gargle this medication in your mouth and spit it out. You may do this up to 4 times a day. Please follow up with  your primary care for further evaluation if needed.

## 2018-10-30 ENCOUNTER — Telehealth: Payer: Self-pay | Admitting: *Deleted

## 2018-10-30 NOTE — Telephone Encounter (Signed)
Pharmacy called related to Rx: Nystatin  .Marland KitchenMarland KitchenEDCM clarified with EDP to change Rx to: .

## 2019-03-28 IMAGING — CR DG KNEE COMPLETE 4+V*L*
4 series · 4 of 4 positions shown · non-contrast
Comparison: MR 02/03/2017, plain film 01/15/2017

CLINICAL DATA: 21-year-old male with history of knee injury known
prior ACL injury

EXAM:
LEFT KNEE - COMPLETE 4+ VIEW

[t knee ap left]
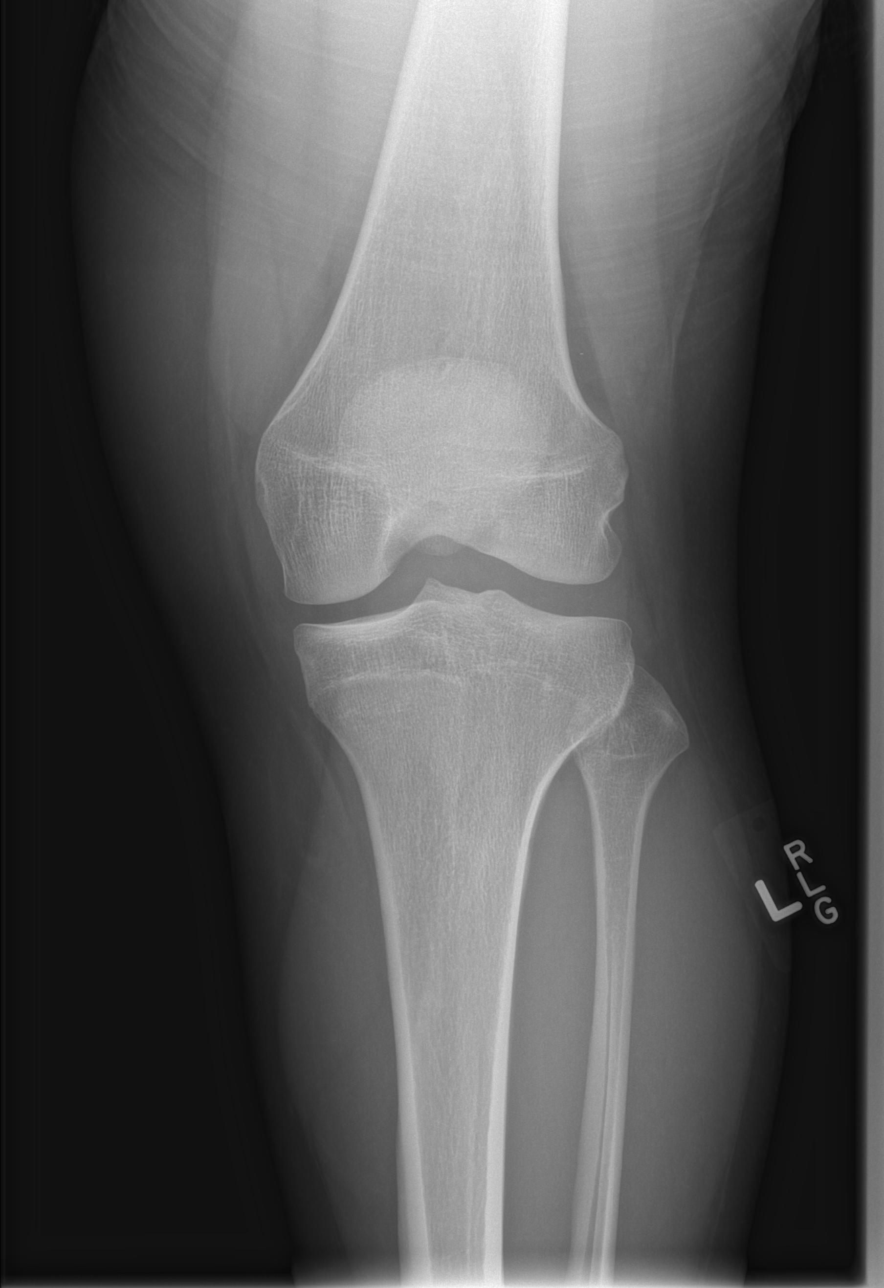

[t knee obl left (1 of 2)]
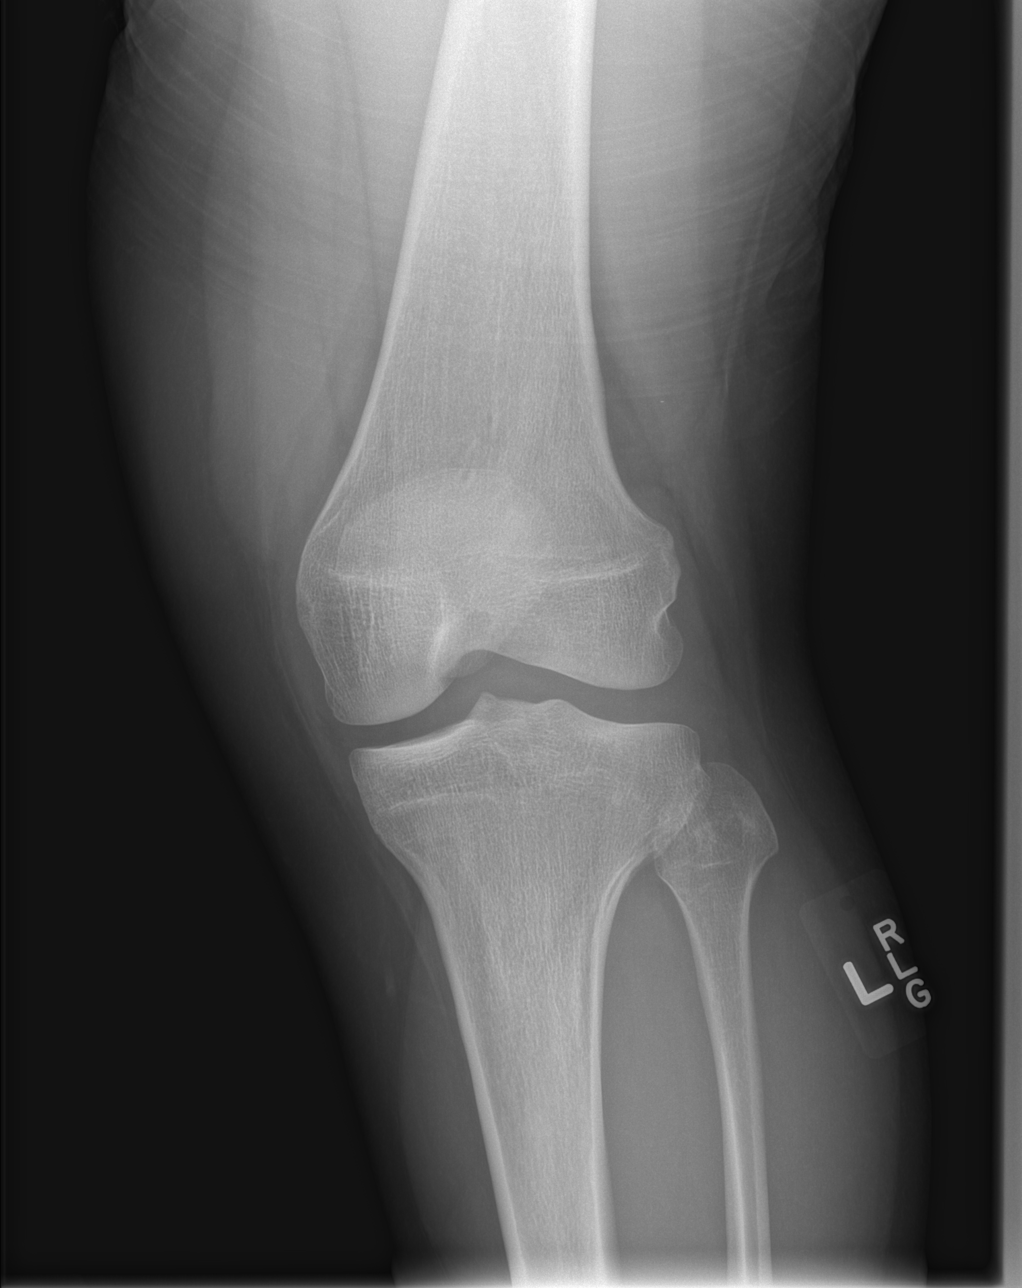

[t knee obl left (2 of 2)]
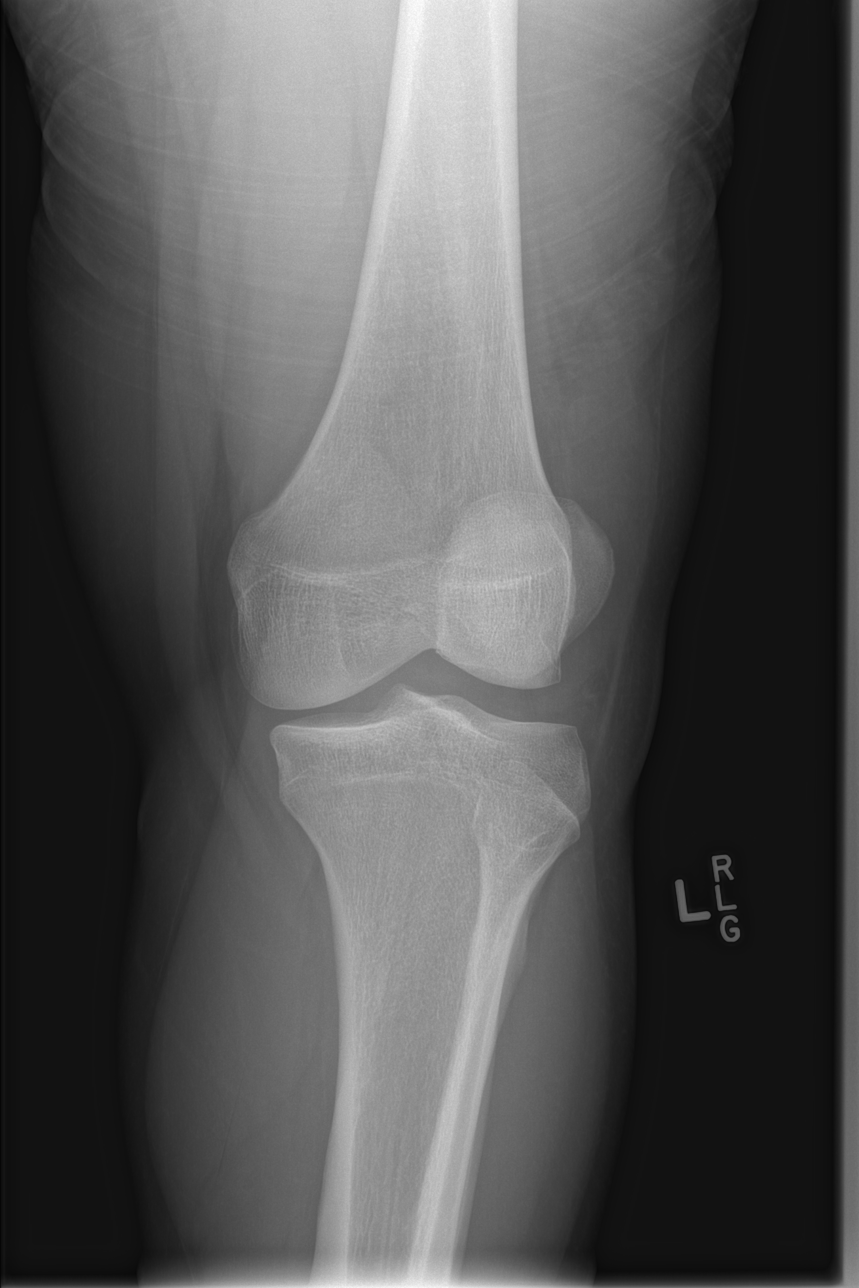

[t knee lat left]
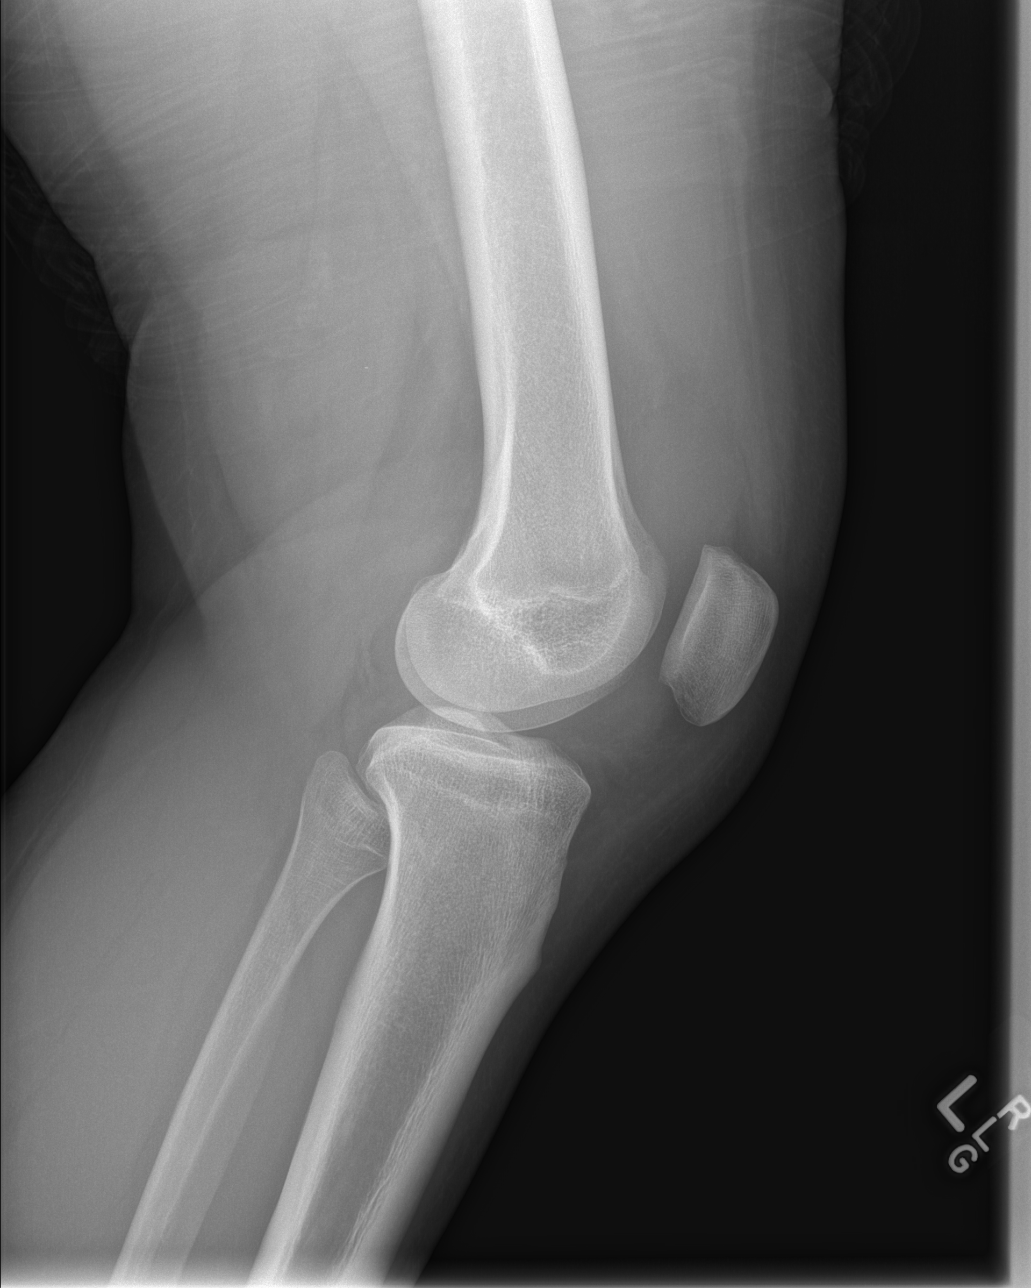

[4 of 4 positions shown; findings below may reference images not displayed]

FINDINGS: No acute displaced fracture identified. No significant degenerative
changes. No radiopaque foreign body.

Lateral view demonstrates joint effusion.
IMPRESSION: Left knee joint effusion with no acute bony abnormality.

## 2020-12-25 ENCOUNTER — Emergency Department (HOSPITAL_COMMUNITY): Payer: Self-pay

## 2020-12-25 ENCOUNTER — Other Ambulatory Visit: Payer: Self-pay

## 2020-12-25 ENCOUNTER — Emergency Department (HOSPITAL_COMMUNITY)
Admission: EM | Admit: 2020-12-25 | Discharge: 2020-12-25 | Disposition: A | Discharge status: Home / Self Care | Payer: Self-pay | Attending: Emergency Medicine | Admitting: Emergency Medicine

## 2020-12-25 DIAGNOSIS — F17210 Nicotine dependence, cigarettes, uncomplicated: Secondary | ICD-10-CM | POA: Insufficient documentation

## 2020-12-25 DIAGNOSIS — R030 Elevated blood-pressure reading, without diagnosis of hypertension: Secondary | ICD-10-CM

## 2020-12-25 DIAGNOSIS — J45909 Unspecified asthma, uncomplicated: Secondary | ICD-10-CM | POA: Insufficient documentation

## 2020-12-25 DIAGNOSIS — R519 Headache, unspecified: Secondary | ICD-10-CM | POA: Insufficient documentation

## 2020-12-25 DIAGNOSIS — I10 Essential (primary) hypertension: Secondary | ICD-10-CM | POA: Insufficient documentation

## 2020-12-25 DIAGNOSIS — R42 Dizziness and giddiness: Secondary | ICD-10-CM | POA: Insufficient documentation

## 2020-12-25 LAB — COMPREHENSIVE METABOLIC PANEL
ALT: 58 U/L — ABNORMAL HIGH (ref 0–44)
AST: 29 U/L (ref 15–41)
Albumin: 4 g/dL (ref 3.5–5.0)
Alkaline Phosphatase: 51 U/L (ref 38–126)
Anion gap: 9 (ref 5–15)
BUN: 11 mg/dL (ref 6–20)
CO2: 25 mmol/L (ref 22–32)
Calcium: 9.3 mg/dL (ref 8.9–10.3)
Chloride: 105 mmol/L (ref 98–111)
Creatinine, Ser: 0.97 mg/dL (ref 0.61–1.24)
GFR, Estimated: >60 mL/min (ref >60)
Glucose, Bld: 95 mg/dL (ref 70–99)
Potassium: 4 mmol/L (ref 3.5–5.1)
Sodium: 139 mmol/L (ref 135–145)
Total Bilirubin: 0.7 mg/dL (ref 0.3–1.2)
Total Protein: 7 g/dL (ref 6.5–8.1)

## 2020-12-25 LAB — CBC
HCT: 48.5 % (ref 39.0–52.0)
Hemoglobin: 16.3 g/dL (ref 13.0–17.0)
MCH: 27.9 pg (ref 26.0–34.0)
MCHC: 33.6 g/dL (ref 30.0–36.0)
MCV: 83 fL (ref 80.0–100.0)
Platelets: 286 10*3/uL (ref 150–400)
RBC: 5.84 MIL/uL — ABNORMAL HIGH (ref 4.22–5.81)
RDW: 13.2 % (ref 11.5–15.5)
WBC: 11.4 10*3/uL — ABNORMAL HIGH (ref 4.0–10.5)
nRBC: 0 % (ref 0.0–0.2)

## 2020-12-25 NOTE — ED Provider Notes (Signed)
Jay Haas is a 25 y.o. male with no significant past medical history who presents emergency department with chief complaint of headache and hypertension.  Patient states he awoke with a severe headache behind the left eye.  He states that on his way to work the pain increased and he became diaphoretic.  When he got to work he told his boss that he was feeling very badly with headache and dizziness.  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  He has no previous history of hypertension.  He did take famotidine last night for reflux symptoms.  He denies any active chest pain or shortness of breath.  He is a daily smoker.  Both of his parents have hypertension.  He denies active headache at this time, nausea, vomiting, light sensitivity, fever, neck stiffness.  Has noticed no swelling in his lower extremities.  He denies any sympathomimetic abuse and has not taken any decongestions or over-the-counter cold medications recently.  HPI     Past Medical History:  Diagnosis Date  . ACL (anterior cruciate ligament) tear   . Asthma   . Knee pain, left    ACL tear    Patient Active Problem List   Diagnosis Date Noted  . New ACL tear, left, initial encounter 02/07/2017    No past surgical history on file.     Family History  Problem Relation Age of Onset  . Hypertension Mother   . Hypertension Father     Social History   Tobacco Use  . Smoking status: Current Every Day Smoker    Packs/day: 0.50    Types: Cigarettes  . Smokeless tobacco: Never Used  Substance Use Topics  . Alcohol use: Yes    Comment: occ  . Drug use: Yes    Types: Marijuana    Home Medications Prior to Admission medications   Medication Sig Start Date End Date  Taking? Authorizing Provider  albuterol (PROVENTIL HFA;VENTOLIN HFA) 108 (90 BASE) MCG/ACT inhaler Inhale 2 puffs into the lungs every 6 (six) hours as needed for wheezing or shortness of breath.    [provider]  cetirizine (ZYRTEC) 10 MG tablet Take 1 tablet (10 mg total) by mouth daily. Patient not taking: Reported on 01/15/2017 10/31/14   Charm Rings, MD  cyclobenzaprine (FLEXERIL) 10 MG tablet Take 1 tablet (10 mg total) by mouth at bedtime. Patient not taking: Reported on 01/15/2017 04/03/16   Deatra Canter, FNP  fexofenadine (ALLEGRA) 180 MG tablet Take 180 mg by mouth daily.    [provider]  HYDROcodone-acetaminophen (NORCO/VICODIN) 5-325 MG tablet Take 1-2 tablets by mouth every 6 hours as needed for pain. 07/22/17   Pisciotta, Joni Reining, PA-C  ibuprofen (ADVIL,MOTRIN) 200 MG tablet Take 600 mg by mouth every 6 (six) hours as needed for pain.    [provider]  naproxen (NAPROSYN) 500 MG tablet Take 1 tablet (500 mg total) by mouth 2 (two) times daily. Patient not taking: Reported on 01/15/2017 11/15/13   Vanetta Mulders, MD  naproxen (NAPROSYN) 500 MG tablet Take 1 tablet (500 mg total) by mouth 2 (two) times daily with a meal. Patient not taking: Reported on 01/15/2017 04/03/16   Deatra Canter, FNP  traMADol (ULTRAM) 50 MG tablet Take 1 tablet (50 mg total) by mouth every 6 (six) hours as needed. Patient not taking: Reported on 01/15/2017 11/15/13   Vanetta Mulders, MD  trimethoprim-polymyxin b (POLYTRIM) ophthalmic solution Place 1 drop into the right eye every 4 (four) hours. For 4 days Patient not taking: Reported on 01/15/2017 04/04/16   Hayden Rasmussen, NP    Allergies    Codeine  Review of Systems   Review of Systems Ten systems reviewed and are negative for acute change, except as noted in the HPI.   Physical Exam Updated Vital Signs BP (!) 142/98 (BP Location: Right Arm)   Pulse (!) 59   Temp (!) 97 F (36.1 C)   Resp 15   SpO2 100%    Physical Exam Vitals and nursing note reviewed.  Constitutional:      General: He is not in acute distress.    Appearance: He is well-developed. He is not diaphoretic.  HENT:     Head: Normocephalic and atraumatic.  Eyes:     General: No scleral icterus.    Conjunctiva/sclera: Conjunctivae normal.     Pupils: Pupils are equal, round, and reactive to light.     Comments: No horizontal, vertical or rotational nystagmus  Neck:     Comments: Full active and passive ROM without pain No midline or paraspinal tenderness No nuchal rigidity or meningeal signs Cardiovascular:     Rate and Rhythm: Normal rate and regular rhythm.     Heart sounds: Normal heart sounds.  Pulmonary:     Effort: Pulmonary effort is normal. No respiratory distress.     Breath sounds: Normal breath sounds. No wheezing or rales.  Abdominal:     General: Bowel sounds are normal.     Palpations: Abdomen is soft.     Tenderness: There is no abdominal tenderness. There is no guarding or rebound.  Musculoskeletal:        General: Normal range of motion.     Cervical back: Normal range of motion and neck supple.  Lymphadenopathy:     Cervical: No cervical adenopathy.  Skin:    General: Skin is warm and dry.     Findings: No rash.  Neurological:     Mental Status: He is alert and oriented to person, place, and time.     Cranial Nerves: No cranial nerve deficit.     Motor: No abnormal muscle tone.     Coordination: Coordination normal.     Comments: Mental Status:  Alert, oriented, thought content appropriate. Speech fluent without evidence of aphasia. Able to follow 2 step commands without difficulty.  Cranial Nerves:  II:  Peripheral visual fields grossly normal, pupils equal, round, reactive to light III,IV, VI: ptosis not present, extra-ocular motions intact bilaterally  V,VII: smile symmetric, facial light touch sensation equal VIII: hearing grossly normal bilaterally  IX,X: midline uvula rise  XI:  bilateral shoulder shrug equal and strong XII: midline tongue extension  Motor:  5/5 in upper and lower extremities bilaterally including strong and equal grip strength and dorsiflexion/plantar flexion Sensory: Pinprick and light touch normal in all extremities.  Cerebellar: normal finger-to-nose with bilateral upper extremities Gait: normal gait and balance CV: distal pulses palpable throughout   Psychiatric:        Behavior: Behavior normal.        Thought Content: Thought content normal.        Judgment: Judgment normal.     ED Results / Procedures / Treatments  Labs (all labs ordered are listed, but only abnormal results are displayed) Labs Reviewed  CBC - Abnormal; Notable for the following components:      Result Value   WBC 11.4 (*)    RBC 5.84 (*)    All other components within normal limits  COMPREHENSIVE METABOLIC PANEL - Abnormal; Notable for the following components:   ALT 58 (*)    All other components within normal limits    EKG  Date: 12/25/2020  Rate: 49  Rhythm: normal sinus rhythm  QRS Axis: right  Intervals: normal  ST/T Wave abnormalities: nonspecific T wave changes  Conduction Disutrbances:none  Narrative Interpretation:   Old EKG Reviewed: none available  I have personally reviewed the EKG tracing and agree with the computerized printout as noted.  Radiology DG Chest 2 View  Result Date: 12/25/2020 CLINICAL DATA:  Hypertension, headache, asthma, current smoker EXAM: CHEST - 2 VIEW COMPARISON:  None. FINDINGS: The heart size and mediastinal contours are within normal limits. Both lungs are clear. The visualized skeletal structures are unremarkable. IMPRESSION: No acute abnormality of the lungs. Electronically Signed   By: Lauralyn PrimesAlex  Bibbey M.D.   On: 12/25/2020 13:15   CT Head Wo Contrast  Result Date: 12/25/2020 CLINICAL DATA:  Headache, cluster/trigeminal. Hypertension/headache. EXAM: CT HEAD WITHOUT CONTRAST TECHNIQUE: Contiguous axial images were  obtained from the base of the skull through the vertex without intravenous contrast. COMPARISON:  No pertinent prior exams available for comparison. FINDINGS: Brain: Cerebral volume is normal. There is no acute intracranial hemorrhage. No demarcated cortical infarct. No extra-axial fluid collection. No evidence of intracranial mass. No midline shift. Vascular: No hyperdense vessel. Skull: Normal. Negative for fracture or focal lesion. Sinuses/Orbits: Visualized orbits show no acute finding. Mild paranasal sinus mucosal thickening at the imaged levels. IMPRESSION: Unremarkable non-contrast CT appearance of the brain. No evidence of acute intracranial abnormality. Mild paranasal sinus mucosal thickening at the imaged levels. Electronically Signed   By: Jackey LogeKyle  Golden DO   On: 12/25/2020 13:19    Procedures Procedures {  Medications Ordered in ED Medications - No data to display  ED Course  I have reviewed the triage vital signs and the nursing notes.  Pertinent labs & imaging results that were available during my care of the patient were reviewed by me and considered in my medical decision making (see chart for details).    MDM Rules/Calculators/A&P                          25 year old male who presents emergency department with chief complaint of headache and hypertension. Emergent considerations for headache include subarachnoid hemorrhage, meningitis, temporal arteritis, glaucoma, cerebral ischemia, carotid/vertebral dissection, intracranial tumor, Venous sinus thrombosis, carbon monoxide poisoning, acute or chronic subdural hemorrhage.  Other considerations include: Migraine, Cluster headache, Hypertension, Caffeine, alcohol, or drug withdrawal, Pseudotumor cerebri, Arteriovenous malformation, Head injury, Neurocysticercosis, Post-lumbar puncture, Preeclampsia, Tension headache, Sinusitis, Cervical arthritis, Refractive error causing strain, Dental abscess, Otitis media, Temporomandibular joint  syndrome, Depression, Somatoform disorder (eg, somatization) Trigeminal neuralgia, Glossopharyngeal neuralgia. I ordered and reviewed labs that include CBC which shows mildly elevated white blood cell count of insignificant value likely acute phase reaction.  CMP also with mildly elevated ALT also of insignificant value.  Have reassessed the patient and he continues to be neurologically stable, without deficit and currently headache free. Patient's blood pressures have down trended but are still mildly elevated. I ordered and reviewed a head CT which shows no acute  abnormality, 2 view chest x-ray also shows no acute abnormalities no signs of cardiomegaly. EKG without signs of acute ischemia. Patient will be referred to OP clinic for follow up on blood pressure.     Final Clinical Impression(s) / ED Diagnoses Final diagnoses:  Elevated blood pressure reading  Bad headache    Rx / DC Orders ED Discharge Orders    None       Arthor Captain, PA-C 12/25/20 1351    Sabino Donovan, MD 12/25/20 [phone]

## 2020-12-25 NOTE — Discharge Instructions (Addendum)
Contact a health care provider if: You think you are having a reaction to medicines you have taken. You have repeated (recurrent) headaches. You feel dizzy. You have swelling in your ankles. You have trouble with your vision. Get help right away if: You develop a severe headache or confusion. You have unusual weakness or numbness, or you feel faint. You have severe pain in your chest or abdomen. You vomit repeatedly. You have trouble breathing. 

## 2020-12-25 NOTE — ED Triage Notes (Signed)
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

## 2020-12-25 NOTE — ED Notes (Signed)
Patient transported to X-ray 

## 2020-12-25 NOTE — ED Notes (Signed)
Patient transported to CT 

## 2021-03-02 ENCOUNTER — Other Ambulatory Visit: Payer: Self-pay

## 2021-03-02 ENCOUNTER — Encounter: Payer: Self-pay | Admitting: Internal Medicine

## 2021-03-02 ENCOUNTER — Ambulatory Visit: Payer: Self-pay | Attending: Internal Medicine | Admitting: Internal Medicine

## 2021-03-02 VITALS — BP 144/85 | HR 64 | Resp 16 | Ht 68.0 in | Wt 245.4 lb

## 2021-03-02 DIAGNOSIS — I10 Essential (primary) hypertension: Secondary | ICD-10-CM

## 2021-03-02 DIAGNOSIS — Z7689 Persons encountering health services in other specified circumstances: Secondary | ICD-10-CM

## 2021-03-02 DIAGNOSIS — R7989 Other specified abnormal findings of blood chemistry: Secondary | ICD-10-CM

## 2021-03-02 DIAGNOSIS — R945 Abnormal results of liver function studies: Secondary | ICD-10-CM

## 2021-03-02 DIAGNOSIS — F17200 Nicotine dependence, unspecified, uncomplicated: Secondary | ICD-10-CM | POA: Insufficient documentation

## 2021-03-02 DIAGNOSIS — J4520 Mild intermittent asthma, uncomplicated: Secondary | ICD-10-CM | POA: Insufficient documentation

## 2021-03-02 MED ORDER — AMLODIPINE BESYLATE 5 MG PO TABS: 5.0000 mg | ORAL_TABLET | Freq: Every day | ORAL | 5 refills | Status: AC

## 2021-03-02 MED ORDER — ALBUTEROL SULFATE HFA 108 (90 BASE) MCG/ACT IN AERS: 2.0000 | INHALATION_SPRAY | Freq: Four times a day (QID) | RESPIRATORY_TRACT | 4 refills | Status: AC | PRN

## 2021-03-02 NOTE — Patient Instructions (Signed)
Start amlodipine 5 mg daily for your blood pressure.  Check the blood pressure about twice a week.  Goal is 130/80 or lower.  Set a quit date to quit smoking.  Steps to Quit Smoking Smoking tobacco is the leading cause of preventable death. It can affect almost every organ in the body. Smoking puts you and those around you at risk for developing many serious chronic diseases. Quitting smoking can be difficult, but it is one of the best things that you can do for your health. It is nevertoo late to quit. How do I get ready to quit? When you decide to quit smoking, create a plan to help you succeed. Before you quit: Pick a date to quit. Set a date within the next 2 weeks to give you time to prepare. Write down the reasons why you are quitting. Keep this list in places where you will see it often. Tell your family, friends, and co-workers that you are quitting. Support from your loved ones can make quitting easier. Talk with your health care provider about your options for quitting smoking. Find out what treatment options are covered by your health insurance. Identify people, places, things, and activities that make you want to smoke (triggers). Avoid them. What first steps can I take to quit smoking? Throw away all cigarettes at home, at work, and in your car. Throw away smoking accessories, such as Set designer. Clean your car. Make sure to empty the ashtray. Clean your home, including curtains and carpets. What strategies can I use to quit smoking? Talk with your health care provider about combining strategies, such as taking medicines while you are also receiving in-person counseling. Using these two strategies together makes you more likely to succeed in quitting than if you used either strategy on its own. If you are pregnant or breastfeeding, talk with your health care provider about finding counseling or other support strategies to quit smoking. Do not take medicine to help you  quit smoking unless your health care provider tells you to do so. To quit smoking: Quit right away Quit smoking completely, instead of gradually reducing how much you smoke over a period of time. Research shows that stopping smoking right away is more successful than gradually quitting. Attend in-person counseling to help you build problem-solving skills. You are more likely to succeed in quitting if you attend counseling sessions regularly. Even short sessions of 10 minutes can be effective. Take medicine You may take medicines to help you quit smoking. Some medicines require a prescription and some you can purchase over-the-counter. Medicines may have nicotine in them to replace the nicotine in cigarettes. Medicines may: Help to stop cravings. Help to relieve withdrawal symptoms. Your health care provider may recommend: Nicotine patches, gum, or lozenges. Nicotine inhalers or sprays. Non-nicotine medicine that is taken by mouth. Find resources Find resources and support systems that can help you to quit smoking and remain smoke-free after you quit. These resources are most helpful when you use them often. They include: Online chats with a Veterinary surgeon. Telephone quitlines. Printed Materials engineer. Support groups or group counseling. Text messaging programs. Mobile phone apps or applications. Use apps that can help you stick to your quit plan by providing reminders, tips, and encouragement. There are many free apps for mobile devices as well as websites. Examples include Quit Guide from the [employer] and smokefree.gov What things can I do to make it easier to quit?  Reach out to your family and friends for support  and encouragement. Call telephone quitlines (1-800-QUIT-NOW), reach out to support groups, or work with a counselor for support. Ask people who smoke to avoid smoking around you. Avoid places that trigger you to smoke, such as bars, parties, or smoke-break areas at work. Spend time  with people who do not smoke. Lessen the stress in your life. Stress can be a smoking trigger for some people. To lessen stress, try: Exercising regularly. Doing deep-breathing exercises. Doing yoga. Meditating. Performing a body scan. This involves closing your eyes, scanning your body from head to toe, and noticing which parts of your body are particularly tense. Try to relax the muscles in those areas. How will I feel when I quit smoking? Day 1 to 3 weeks Within the first 24 hours of quitting smoking, you may start to feel withdrawal symptoms. These symptoms are usually most noticeable 2-3 days after quitting, but they usually do not last for more than 2-3 weeks. You may experience these symptoms: Mood swings. Restlessness, anxiety, or irritability. Trouble concentrating. Dizziness. Strong cravings for sugary foods and nicotine. Mild weight gain. Constipation. Nausea. Coughing or a sore throat. Changes in how the medicines that you take for unrelated issues work in your body. Depression. Trouble sleeping (insomnia). Week 3 and afterward After the first 2-3 weeks of quitting, you may start to notice more positive results, such as: Improved sense of smell and taste. Decreased coughing and sore throat. Slower heart rate. Lower blood pressure. Clearer skin. The ability to breathe more easily. Fewer sick days. Quitting smoking can be very challenging. Do not get discouraged if you are not successful the first time. Some people need to make many attempts to quit before they achieve long-term success. Do your best to stick to your quit plan, and talk with your health care provider if you haveany questions or concerns. Summary Smoking tobacco is the leading cause of preventable death. Quitting smoking is one of the best things that you can do for your health. When you decide to quit smoking, create a plan to help you succeed. Quit smoking right away, not slowly over a period of  time. When you start quitting, seek help from your health care provider, family, or friends. This information is not intended to replace advice given to you by your health care provider. Make sure you discuss any questions you have with your healthcare provider. Document Revised: 04/16/2019 Document Reviewed: 10/10/2018 Elsevier Patient Education  2022 [employer].

## 2021-03-02 NOTE — Progress Notes (Signed)
Patient ID: Jay Haas, male    DOB: [birth year]  MRN: [id number]  CC: New Patient (Initial Visit)   Subjective: Jay Haas is a 25 y.o. male who presents for new pt visit His concerns today include:  Patient with history of HTN, asthma, tobacco dependence  Pt does not have PCP.  Seen in ER 12/2020 with HA and elev BP.  Note reviewed.  Blood pressure was 200/131 that was checked at the fire station prior to going to the emergency room.  At the emergency room his blood pressure was 142/98.  CAT scan of the head was negative.  Chest x-ray negative.  Chemistry revealed mild elevation in ALT.  CBC with mild elevation in WBC of 11.4.  Patient was discharged home in a stable condition. Checking BP 2 x a day since then.  Gives range 130-140s/80s. He has been trying to limit salt in foods.   No further HA. CP/SOB/LE edema Family history of hypertension in both his mother and father. He smokes 1/2 pk/day of cigarettes.  Smoked for 9 yrs. He wants to quit, has a 57 yr old daughter and wants to be around and healthy for her.  Quit 3-4 mths 2 yrs ago.  Used gum to help him quit at that time.  Restarted due to stress.  Currently vaping to try to help him quit currently.  Asthma: He has history of asthma.  He states that his asthma was worse when he was a child.  Now it bothers him only with certain triggers like if he is exposed to dust.  He uses albuterol once most days.  Requests refills.   Patient Active Problem List   Diagnosis Date Noted   New ACL tear, left, initial encounter 02/07/2017     No current outpatient medications on file prior to visit.   No current facility-administered medications on file prior to visit.    Allergies  Allergen Reactions   Codeine Other (See Comments)    Caused patient to be extremely hipper     Social History   Socioeconomic History   Marital status: Single    Spouse name: Not on file   Number of children: 1   Years of education: Not on file    Highest education level: 12th grade  Occupational History   Occupation: Exterminator  Tobacco Use   Smoking status: Every Day    Packs/day: 0.50    Types: Cigarettes   Smokeless tobacco: Never  Vaping Use   Vaping Use: Every day  Substance and Sexual Activity   Alcohol use: Yes    Comment: occ   Drug use: Yes    Types: Marijuana   Sexual activity: Not on file  Other Topics Concern   Not on file  Social History Narrative   Not on file   Social Determinants of Health   Financial Resource Strain: Not on file  Food Insecurity: Not on file  Transportation Needs: Not on file  Physical Activity: Not on file  Stress: Not on file  Social Connections: Not on file  Intimate Partner Violence: Not on file    Family History  Problem Relation Age of Onset   Hypertension Mother    Hypertension Father    Diabetes Maternal Grandmother     Past Surgical History:  Procedure Laterality Date   NO PAST SURGERIES      ROS: Review of Systems Negative except as stated above  PHYSICAL EXAM: BP (!) 144/85   Pulse 64  Resp 16   Ht 5\' 8"  (1.727 m)   Wt 245 lb 6.4 oz (111.3 kg)   SpO2 97%   BMI 37.31 kg/m   Physical Exam  Repeat blood pressure 132/86 General appearance - alert, well appearing, young Caucasian male and in no distress Mental status - normal mood, behavior, speech, dress, motor activity, and thought processes Neck - supple, no significant adenopathy Chest - clear to auscultation, no wheezes, rales or rhonchi, symmetric air entry Heart - normal rate, regular rhythm, normal S1, S2, no murmurs, rubs, clicks or gallops Extremities - peripheral pulses normal, no pedal edema, no clubbing or cyanosis  Depression screen [facility] 2/9 03/02/2021  Decreased Interest 0  Down, Depressed, Hopeless 0  PHQ - 2 Score 0    CMP Latest Ref Rng & Units 12/25/2020  Glucose 70 - 99 mg/dL 95  BUN 6 - 20 mg/dL 11  Creatinine 12/27/2020 - 1.82 mg/dL 9.93  Sodium 7.16 - 967 mmol/L 139   Potassium 3.5 - 5.1 mmol/L 4.0  Chloride 98 - 111 mmol/L 105  CO2 22 - 32 mmol/L 25  Calcium 8.9 - 10.3 mg/dL 9.3  Total Protein 6.5 - 8.1 g/dL 7.0  Total Bilirubin 0.3 - 1.2 mg/dL 0.7  Alkaline Phos 38 - 126 U/L 51  AST 15 - 41 U/L 29  ALT 0 - 44 U/L 58(H)   Lipid Panel  No results found for: CHOL, TRIG, HDL, CHOLHDL, VLDL, LDLCALC, LDLDIRECT  CBC    Component Value Date/Time   WBC 11.4 (H) 12/25/2020 1130   RBC 5.84 (H) 12/25/2020 1130   HGB 16.3 12/25/2020 1130   HCT 48.5 12/25/2020 1130   PLT 286 12/25/2020 1130   MCV 83.0 12/25/2020 1130   MCH 27.9 12/25/2020 1130   MCHC 33.6 12/25/2020 1130   RDW 13.2 12/25/2020 1130    ASSESSMENT AND PLAN: 1. Encounter to establish care   2. Essential hypertension Advised patient of normal blood pressure is less than 120/80.  Given his home blood pressure readings are in the range for stage I hypertension and is elevated here today also, I think it is reasonable for 12/27/2020 to start him on a low-dose of blood pressure medication.  Patient is agreeable to that.  DASH diet discussed and encouraged.  Advised to continue to check blood pressure at least once a week with goal being 130/80 or lower. We do not need to check chemistry as he had recent 1 done through the emergency room.  Creatinine and GFR normal. - amLODipine (NORVASC) 5 MG tablet; Take 1 tablet (5 mg total) by mouth daily.  Dispense: 30 tablet; Refill: 5  3. Tobacco dependence Pt is current smoker. Patient advised to quit smoking. Discussed health risks associated with smoking including lung and other types of cancers, chronic lung diseases and CV risks.. Pt ready to give trail of quitting.   Discussed methods to help quit including quitting cold [place], use of NRT, Chantix and Bupropion.  Pt wanting to try: Patient wants to continue trying to quit on his own.  I encouraged him to set a quit date.  Advised against vaping as it can also cause acute lung damage. _4_ Minutes  spent on counseling. F/U: Reassess progress on subsequent visit   4. Mild intermittent asthma without complication Stable.  Try to avoid triggers. - albuterol (VENTOLIN HFA) 108 (90 Base) MCG/ACT inhaler; Inhale 2 puffs into the lungs every 6 (six) hours as needed for wheezing or shortness of breath.  Dispense: 18  g; Refill: 4  5. Abnormal LFTs Recommend screening for hepatitis C.  Patient is agreeable to this. - Hepatitis C Antibody   Patient was given the opportunity to ask questions.  Patient verbalized understanding of the plan and was able to repeat key elements of the plan.   Orders Placed This Encounter  Procedures   Hepatitis C Antibody     Requested Prescriptions   Signed Prescriptions Disp Refills   albuterol (VENTOLIN HFA) 108 (90 Base) MCG/ACT inhaler 18 g 4    Sig: Inhale 2 puffs into the lungs every 6 (six) hours as needed for wheezing or shortness of breath.   amLODipine (NORVASC) 5 MG tablet 30 tablet 5    Sig: Take 1 tablet (5 mg total) by mouth daily.    Return in about 4 months (around 07/03/2021).  Jonah Blue, MD, FACP

## 2021-03-03 LAB — HEPATITIS C ANTIBODY: Hep C Virus Ab: <0.1 s/co ratio (ref 0.0–0.9)

## 2021-07-03 ENCOUNTER — Ambulatory Visit: Payer: Self-pay | Admitting: Internal Medicine

## 2022-11-26 ENCOUNTER — Ambulatory Visit: Payer: Self-pay | Admitting: Internal Medicine

## 2023-03-03 ENCOUNTER — Ambulatory Visit: Payer: Self-pay | Admitting: Internal Medicine

## 2024-02-13 ENCOUNTER — Emergency Department (HOSPITAL_BASED_OUTPATIENT_CLINIC_OR_DEPARTMENT_OTHER)
Admission: EM | Admit: 2024-02-13 | Discharge: 2024-02-13 | Disposition: A | Discharge status: Home / Self Care | Payer: Self-pay | Attending: Emergency Medicine | Admitting: Emergency Medicine

## 2024-02-13 ENCOUNTER — Other Ambulatory Visit (HOSPITAL_BASED_OUTPATIENT_CLINIC_OR_DEPARTMENT_OTHER): Payer: Self-pay

## 2024-02-13 ENCOUNTER — Other Ambulatory Visit: Payer: Self-pay

## 2024-02-13 ENCOUNTER — Encounter (HOSPITAL_BASED_OUTPATIENT_CLINIC_OR_DEPARTMENT_OTHER): Payer: Self-pay

## 2024-02-13 DIAGNOSIS — Z79899 Other long term (current) drug therapy: Secondary | ICD-10-CM | POA: Insufficient documentation

## 2024-02-13 DIAGNOSIS — L02414 Cutaneous abscess of left upper limb: Secondary | ICD-10-CM | POA: Insufficient documentation

## 2024-02-13 MED ORDER — DOXYCYCLINE HYCLATE 100 MG PO CAPS
100.0000 mg | ORAL_CAPSULE | Freq: Two times a day (BID) | ORAL | 0 refills | Status: DC
  Filled 2024-02-13: qty 14, 7d supply, fill #0

## 2024-02-13 NOTE — ED Triage Notes (Signed)
 Arrives POV with complaints of worsening insect bite site to his left arm x1 week. No fever/drainage. Rates pain a 3/10.

## 2024-02-13 NOTE — Discharge Instructions (Signed)
 Please read and follow all provided instructions.  Your diagnoses today include:  1. Cutaneous abscess of left upper extremity     Tests performed today include: Vital signs. See below for your results today.   Medications prescribed:  Doxycycline  - antibiotic  You have been prescribed an antibiotic medicine: take the entire course of medicine even if you are feeling better. Stopping early can cause the antibiotic not to work.  Take any prescribed medications only as directed.   Home care instructions:  Follow any educational materials contained in this packet  Follow-up instructions: Return to the Emergency Department in 48 hours for a recheck if your symptoms are not significantly improved.  Return instructions:  Return to the Emergency Department if you have: Fever Worsening symptoms Worsening pain Worsening swelling Redness of the skin that moves away from the affected area, especially if it streaks away from the affected area  Any other emergent concerns  Your vital signs today were: BP (!) 160/100 (BP Location: Right Arm)   Pulse 75   Temp 98.3 F (36.8 C) (Oral)   Resp 20   Ht 5' 8 (1.727 m)   Wt 102.1 kg   SpO2 99%   BMI 34.21 kg/m  If your blood pressure (BP) was elevated above 135/85 this visit, please have this repeated by your doctor within one month. --------------

## 2024-02-13 NOTE — ED Notes (Signed)
 Pt DC d before assessment could be performed by provider... DC paperwork given to the Pt and verbally understood.

## 2024-02-13 NOTE — ED Provider Notes (Signed)
 [place] EMERGENCY DEPARTMENT AT [facility] Provider Note   CSN: [id number] Arrival date & time: 02/13/24  9052     Patient presents with: Insect Bite   Jay Haas is a 28 y.o. male.   Patient presents to the emergency department today for evaluation of a red bump on his left forearm that started about 5 days ago.  Patient states that he felt a bug bite him at home.  Initially the area looked like a mosquito bite.  3 days ago he was able to express some fluid from the area.  Since that time the area has worsened and become more red.  No additional drainage.  No fevers.  No distal numbness or tingling.       Prior to Admission medications   Medication Sig Start Date End Date Taking? Authorizing Provider  doxycycline  (VIBRAMYCIN ) 100 MG capsule Take 1 capsule (100 mg total) by mouth 2 (two) times daily. 02/13/24  Yes Desiderio Chew, PA-C  albuterol  (VENTOLIN  HFA) 108 (90 Base) MCG/ACT inhaler Inhale 2 puffs into the lungs every 6 (six) hours as needed for wheezing or shortness of breath. 03/02/21   Vicci Barnie NOVAK, MD  amLODipine  (NORVASC ) 5 MG tablet Take 1 tablet (5 mg total) by mouth daily. 03/02/21   Vicci Barnie NOVAK, MD    Allergies: Codeine    Review of Systems  Updated Vital Signs BP (!) 160/100 (BP Location: Right Arm)   Pulse 75   Temp 98.3 F (36.8 C) (Oral)   Resp 20   Ht 5' 8 (1.727 m)   Wt 102.1 kg   SpO2 99%   BMI 34.21 kg/m   Physical Exam Vitals and nursing note reviewed.  Constitutional:      Appearance: He is well-developed.  HENT:     Head: Normocephalic and atraumatic.  Eyes:     Conjunctiva/sclera: Conjunctivae normal.  Pulmonary:     Effort: No respiratory distress.  Musculoskeletal:     Cervical back: Normal range of motion and neck supple.     Comments: Left forearm nodule noted with overlying erythema, no significant extension of cellulitis.  Area is minimally tender.  There is no significant fluctuance but it is  indurated, about 1 cm.  Skin:    General: Skin is warm and dry.  Neurological:     Mental Status: He is alert.     (all labs ordered are listed, but only abnormal results are displayed) Labs Reviewed - No data to display  EKG: None  Radiology: No results found.   Procedures   Medications Ordered in the ED - No data to display   Patient seen and examined. History obtained directly from patient.   Labs/EKG: None ordered  Imaging: None ordered  Medications/Fluids: None ordered  Most recent vital signs reviewed and are as follows: BP (!) 160/100 (BP Location: Right Arm)   Pulse 75   Temp 98.3 F (36.8 C) (Oral)   Resp 20   Ht 5' 8 (1.727 m)   Wt 102.1 kg   SpO2 99%   BMI 34.21 kg/m   Initial impression: Cutaneous abscess, left forearm, no significant fluctuance.  Discussed I&D, based on exam, do not feel that there is significant abscess at this time.  Will place on doxycycline .  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  The patient was urged to return to the Emergency Department urgently with worsening pain, swelling, expanding erythema especially if it streaks away from the  affected area, fever, or if they have any other concerns.   The patient was urged to return to the Emergency Department or go to their PCP in 48 hours for wound recheck if the area is not significantly improved.                                  Medical Decision Making Risk Prescription drug management.   Patient with small decompressed abscess left forearm.  No significant cellulitis.  Given exam, will defer I&D at this time.  Area is small and nonfluctuant.  Will trial antibiotics and warm compresses.     Final diagnoses:  Cutaneous abscess of left upper extremity    ED Discharge Orders          Ordered    doxycycline  (VIBRAMYCIN ) 100 MG capsule  2 times daily        02/13/24 1012               Desiderio Chew, PA-C 02/13/24  1019    Dean Clarity, MD 02/13/24 1548

## 2024-05-04 ENCOUNTER — Other Ambulatory Visit (HOSPITAL_BASED_OUTPATIENT_CLINIC_OR_DEPARTMENT_OTHER): Payer: Self-pay

## 2024-05-04 MED ORDER — MUPIROCIN 2 % EX OINT
TOPICAL_OINTMENT | CUTANEOUS | 3 refills | Status: AC
  Filled 2024-05-04: qty 22, 7d supply, fill #0

## 2024-05-17 ENCOUNTER — Other Ambulatory Visit: Payer: Self-pay

## 2024-05-17 ENCOUNTER — Emergency Department (HOSPITAL_BASED_OUTPATIENT_CLINIC_OR_DEPARTMENT_OTHER)
Admission: EM | Admit: 2024-05-17 | Discharge: 2024-05-17 | Disposition: A | Discharge status: Home / Self Care | Payer: Self-pay | Attending: Emergency Medicine | Admitting: Emergency Medicine

## 2024-05-17 ENCOUNTER — Encounter (HOSPITAL_BASED_OUTPATIENT_CLINIC_OR_DEPARTMENT_OTHER): Payer: Self-pay | Admitting: Emergency Medicine

## 2024-05-17 ENCOUNTER — Other Ambulatory Visit (HOSPITAL_BASED_OUTPATIENT_CLINIC_OR_DEPARTMENT_OTHER): Payer: Self-pay

## 2024-05-17 DIAGNOSIS — L03114 Cellulitis of left upper limb: Secondary | ICD-10-CM | POA: Insufficient documentation

## 2024-05-17 MED ORDER — DOXYCYCLINE HYCLATE 100 MG PO CAPS
100.0000 mg | ORAL_CAPSULE | Freq: Two times a day (BID) | ORAL | 0 refills | Status: AC
  Filled 2024-05-17: qty 14, 7d supply, fill #0

## 2024-05-17 NOTE — ED Triage Notes (Signed)
 Large insect bite to left bicep and left elbow since Thursday. Denies fever.

## 2024-05-17 NOTE — Discharge Instructions (Addendum)
 Exam is consistent with cellulitis of the arm for which we will treat with a course of doxycycline .  Follow-up with your PCP to ensure resolution, return to the emergency department for any severe worsening symptoms to include worsening swelling, pain, development of fever or chills.  Recommend warm compresses to the affected area daily.

## 2024-05-17 NOTE — ED Provider Notes (Signed)
 [place] EMERGENCY DEPARTMENT AT [facility] Provider Note   CSN: [id number] Arrival date & time: 05/17/24  9282     Patient presents with: Insect Bite   Jay Haas is a 28 y.o. male.   HPI   28 year old male presenting to the emergency department with concern for insect bite and infection to the left arm.  The patient has a history of cellulitis and was treated successfully with doxycycline  in the past.  Since this past Thursday he has had a lesion along his left bicep which has been draining purulence and has had surrounding erythema.  No fevers or chills.  Prior to Admission medications   Medication Sig Start Date End Date Taking? Authorizing Provider  albuterol  (VENTOLIN  HFA) 108 (90 Base) MCG/ACT inhaler Inhale 2 puffs into the lungs every 6 (six) hours as needed for wheezing or shortness of breath. 03/02/21   Vicci Barnie NOVAK, MD  amLODipine  (NORVASC ) 5 MG tablet Take 1 tablet (5 mg total) by mouth daily. 03/02/21   Vicci Barnie NOVAK, MD  doxycycline  (VIBRAMYCIN ) 100 MG capsule Take 1 capsule (100 mg total) by mouth 2 (two) times daily. 05/17/24   Jerrol Agent, MD  mupirocin ointment (BACTROBAN) 2 % Apply to affected area 3 times daily 05/04/24       Allergies: Codeine    Review of Systems  All other systems reviewed and are negative.   Updated Vital Signs BP (!) 147/99 (BP Location: Right Arm)   Pulse 77   Temp 97.9 F (36.6 C) (Oral)   Resp 18   SpO2 99%   Physical Exam Vitals and nursing note reviewed.  Constitutional:      General: He is not in acute distress. HENT:     Head: Normocephalic and atraumatic.  Eyes:     Conjunctiva/sclera: Conjunctivae normal.     Pupils: Pupils are equal, round, and reactive to light.  Cardiovascular:     Rate and Rhythm: Normal rate and regular rhythm.  Pulmonary:     Effort: Pulmonary effort is normal. No respiratory distress.  Abdominal:     General: There is no distension.     Tenderness: There is  no guarding.  Musculoskeletal:        General: No deformity or signs of injury.     Cervical back: Neck supple.  Skin:    Findings: Erythema and lesion present.     Comments: Erythema, warmth, skin thickening in the setting of a lesion which patient states has been draining purulence this morning.  Neurological:     General: No focal deficit present.     Mental Status: He is alert. Mental status is at baseline.     (all labs ordered are listed, but only abnormal results are displayed) Labs Reviewed - No data to display  EKG: None  Radiology: No results found.   SABRAUltrasound ED Soft Tissue  Date/Time: 05/17/2024 8:03 AM  Performed by: Jerrol Agent, MD Authorized by: Jerrol Agent, MD   Procedure details:    Indications: localization of abscess and evaluate for cellulitis     Transverse view:  Visualized   Longitudinal view:  Visualized   Images: not archived   Location:    Location: upper extremity     Side:  Left Findings:     no abscess present    cellulitis present    Medications Ordered in the ED - No data to display  Medical Decision Making Risk Prescription drug management.    28 year old male presenting to the emergency department with concern for insect bite and infection to the left arm.  The patient has a history of cellulitis and was treated successfully with doxycycline  in the past.  Since this past Thursday he has had a lesion along his left bicep which has been draining purulence and has had surrounding erythema.  No fevers or chills.  On arrival, the patient was vitally stable, not tachycardic afebrile.  Patient with evidence of left upper extremity cellulitis.  Ultrasound performed bedside to evaluate for developing abscess, cobblestoning seen on ultrasound, apparent phlegmon but no clear fluid collection to I&D.  Patient states that the wound has been draining purulence, suspect ruptured abscess and surrounding  cellulitis.  Will treat the patient with a course of doxycycline , advised warm compresses, PCP follow-up, return precautions provided.     Final diagnoses:  Cellulitis of left upper extremity    ED Discharge Orders          Ordered    doxycycline  (VIBRAMYCIN ) 100 MG capsule  2 times daily        05/17/24 0802               Jerrol Agent, MD 05/17/24 [phone]

## 2024-05-21 ENCOUNTER — Emergency Department (HOSPITAL_COMMUNITY)
Admission: EM | Admit: 2024-05-21 | Discharge: 2024-05-21 | Disposition: A | Discharge status: Home / Self Care | Payer: Self-pay | Attending: Emergency Medicine | Admitting: Emergency Medicine

## 2024-05-21 ENCOUNTER — Other Ambulatory Visit: Payer: Self-pay

## 2024-05-21 ENCOUNTER — Emergency Department (HOSPITAL_COMMUNITY): Payer: Self-pay

## 2024-05-21 DIAGNOSIS — Z79899 Other long term (current) drug therapy: Secondary | ICD-10-CM | POA: Insufficient documentation

## 2024-05-21 DIAGNOSIS — Z7951 Long term (current) use of inhaled steroids: Secondary | ICD-10-CM | POA: Insufficient documentation

## 2024-05-21 DIAGNOSIS — N200 Calculus of kidney: Secondary | ICD-10-CM | POA: Insufficient documentation

## 2024-05-21 DIAGNOSIS — J45909 Unspecified asthma, uncomplicated: Secondary | ICD-10-CM | POA: Insufficient documentation

## 2024-05-21 DIAGNOSIS — D72829 Elevated white blood cell count, unspecified: Secondary | ICD-10-CM | POA: Insufficient documentation

## 2024-05-21 LAB — URINALYSIS, ROUTINE W REFLEX MICROSCOPIC
Bilirubin Urine: NEGATIVE
Glucose, UA: NEGATIVE mg/dL
Ketones, ur: NEGATIVE mg/dL
Leukocytes,Ua: NEGATIVE
Nitrite: NEGATIVE
Protein, ur: NEGATIVE mg/dL
RBC / HPF: >50 RBC/hpf (ref 0–5)
Specific Gravity, Urine: 1.021 (ref 1.005–1.030)
pH: 5 (ref 5.0–8.0)

## 2024-05-21 LAB — CBC
HCT: 45.4 % (ref 39.0–52.0)
Hemoglobin: 15.2 g/dL (ref 13.0–17.0)
MCH: 27.3 pg (ref 26.0–34.0)
MCHC: 33.5 g/dL (ref 30.0–36.0)
MCV: 81.7 fL (ref 80.0–100.0)
Platelets: 324 K/uL (ref 150–400)
RBC: 5.56 MIL/uL (ref 4.22–5.81)
RDW: 13.4 % (ref 11.5–15.5)
WBC: 12.2 K/uL — ABNORMAL HIGH (ref 4.0–10.5)
nRBC: 0 % (ref 0.0–0.2)

## 2024-05-21 LAB — COMPREHENSIVE METABOLIC PANEL WITH GFR
ALT: 24 U/L (ref 0–44)
AST: 23 U/L (ref 15–41)
Albumin: 3.6 g/dL (ref 3.5–5.0)
Alkaline Phosphatase: 55 U/L (ref 38–126)
Anion gap: 11 (ref 5–15)
BUN: 16 mg/dL (ref 6–20)
CO2: 24 mmol/L (ref 22–32)
Calcium: 9 mg/dL (ref 8.9–10.3)
Chloride: 103 mmol/L (ref 98–111)
Creatinine, Ser: 1.13 mg/dL (ref 0.61–1.24)
GFR, Estimated: >60 mL/min (ref >60)
Glucose, Bld: 138 mg/dL — ABNORMAL HIGH (ref 70–99)
Potassium: 3.6 mmol/L (ref 3.5–5.1)
Sodium: 138 mmol/L (ref 135–145)
Total Bilirubin: 0.6 mg/dL (ref 0.0–1.2)
Total Protein: 6.8 g/dL (ref 6.5–8.1)

## 2024-05-21 LAB — LIPASE, BLOOD: Lipase: 32 U/L (ref 11–51)

## 2024-05-21 MED ORDER — ONDANSETRON HCL 4 MG/2ML IJ SOLN
4.0000 mg | Freq: Once | INTRAMUSCULAR | Status: AC
  Administered 2024-05-21: 4 mg via INTRAVENOUS
  Filled 2024-05-21: qty 2

## 2024-05-21 MED ORDER — MORPHINE SULFATE (PF) 4 MG/ML IV SOLN
4.0000 mg | Freq: Once | INTRAVENOUS | Status: AC
  Administered 2024-05-21: 4 mg via INTRAVENOUS
  Filled 2024-05-21: qty 1

## 2024-05-21 NOTE — ED Provider Notes (Signed)
 [facility] AT [facility] Provider Note   CSN: [id number] Arrival date & time: 05/21/24  0159     Patient presents with: Abdominal Pain   Jay Haas is a 28 y.o. male.   The history is provided by the patient. No language interpreter was used.  Abdominal Pain    28 year old male history of asthma, presenting with complaint of abdominal pain.  Patient report he was awoke this morning with intense pain to the right side of his abdomen.  Pain is sharp shooting, radiates towards his right testicle.  States he felt nauseous and did vomit once.  Currently rates pain a 6 out of 10.  He tries to urinate but noticed no improvement.  Does not endorse any fever or chills no diarrhea constipation no blood in the urine.  Denies any recent heavy lifting or strength activities.  Still has an intact appendix.  No prior history of kidney stone.  Prior to Admission medications   Medication Sig Start Date End Date Taking? Authorizing Provider  albuterol  (VENTOLIN  HFA) 108 (90 Base) MCG/ACT inhaler Inhale 2 puffs into the lungs every 6 (six) hours as needed for wheezing or shortness of breath. 03/02/21   Vicci Barnie NOVAK, MD  amLODipine  (NORVASC ) 5 MG tablet Take 1 tablet (5 mg total) by mouth daily. 03/02/21   Vicci Barnie NOVAK, MD  doxycycline  (VIBRAMYCIN ) 100 MG capsule Take 1 capsule (100 mg total) by mouth 2 (two) times daily. 05/17/24   Jerrol Agent, MD  mupirocin ointment (BACTROBAN) 2 % Apply to affected area 3 times daily 05/04/24       Allergies: Codeine    Review of Systems  Gastrointestinal:  Positive for abdominal pain.  All other systems reviewed and are negative.   Updated Vital Signs BP (!) 151/106 (BP Location: Right Arm)   Pulse 77   Temp (!) 97.5 F (36.4 C) (Oral)   Resp 16   SpO2 99%   Physical Exam Constitutional:      General: He is not in acute distress.    Appearance: He is well-developed.  HENT:     Head: Atraumatic.  Eyes:      Conjunctiva/sclera: Conjunctivae normal.  Cardiovascular:     Rate and Rhythm: Normal rate and regular rhythm.     Pulses: Normal pulses.     Heart sounds: Normal heart sounds.  Pulmonary:     Effort: Pulmonary effort is normal.     Breath sounds: Normal breath sounds.  Abdominal:     Palpations: Abdomen is soft.     Tenderness: There is abdominal tenderness (Mild tenderness to right lower quadrant without guarding rebound tenderness). There is no right CVA tenderness or left CVA tenderness.  Genitourinary:    Comments: Chaperone present during exam.  No inguinal lymphadenopathy or inguinal hernia noted.  Normal circumcised penis without lesion or rash.  No tenderness to bilateral testicles with normal lie.  Normal scrotum. Musculoskeletal:     Cervical back: Normal range of motion and neck supple.  Skin:    Findings: No rash.  Neurological:     Mental Status: He is alert.     (all labs ordered are listed, but only abnormal results are displayed) Labs Reviewed  COMPREHENSIVE METABOLIC PANEL WITH GFR - Abnormal; Notable for the following components:      Result Value   Glucose, Bld 138 (*)    All other components within normal limits  CBC - Abnormal; Notable for the following components:  WBC 12.2 (*)    All other components within normal limits  URINALYSIS, ROUTINE W REFLEX MICROSCOPIC - Abnormal; Notable for the following components:   Hgb urine dipstick MODERATE (*)    Bacteria, UA RARE (*)    All other components within normal limits  LIPASE, BLOOD    EKG: None  Radiology: CT Renal Stone Study Result Date: 05/21/2024 CLINICAL DATA:  Lower abdominal pain. EXAM: CT ABDOMEN AND PELVIS WITHOUT CONTRAST TECHNIQUE: Multidetector CT imaging of the abdomen and pelvis was performed following the standard protocol without IV contrast. RADIATION DOSE REDUCTION: This exam was performed according to the departmental dose-optimization program which includes automated exposure  control, adjustment of the mA and/or kV according to patient size and/or use of iterative reconstruction technique. COMPARISON:  None Available. FINDINGS: Lower chest: 2 mm left lower lobe pulmonary nodule on image 10/series 4 is almost assuredly benign. No followup imaging is recommended. Hepatobiliary: No suspicious focal abnormality in the liver on this study without intravenous contrast. There is no evidence for gallstones, gallbladder wall thickening, or pericholecystic fluid. No intrahepatic or extrahepatic biliary dilation. Pancreas: No focal mass lesion. No dilatation of the main duct. No intraparenchymal cyst. No peripancreatic edema. Spleen: No splenomegaly. No suspicious focal mass lesion. Adrenals/Urinary Tract: No adrenal nodule or mass. Punctate nonobstructing stone identified upper pole left kidney. Left ureter unremarkable. Mild edema noted right kidney with mild fullness of the right intrarenal collecting system and ureter. There is subtle right periureteric edema. These changes are secondary to a punctate very subtle 1 mm stone in the distal right ureter, best seen on coronal image 82/6. Bladder is decompressed. Stomach/Bowel: Stomach is unremarkable. No gastric wall thickening. No evidence of outlet obstruction. Duodenum is normally positioned as is the ligament of Treitz. No small bowel wall thickening. No small bowel dilatation. The terminal ileum is normal. The appendix is normal. No gross colonic mass. No colonic wall thickening. Vascular/Lymphatic: Insert no abdominal aorta There is no gastrohepatic or hepatoduodenal ligament lymphadenopathy. No retroperitoneal or mesenteric lymphadenopathy. No pelvic sidewall lymphadenopathy. Reproductive: The prostate gland and seminal vesicles are unremarkable. Other: No intraperitoneal free fluid. Musculoskeletal: Small left groin hernia contains only fat. No worrisome lytic or sclerotic osseous abnormality. IMPRESSION: 1. Punctate very subtle 1 mm stone  in the distal right ureter about 1 cm proximal to the UVJ with mild fullness of the right intrarenal collecting system and ureter. 2. Punctate nonobstructing stone upper pole left kidney. 3. Small left groin hernia contains only fat. Electronically Signed   By: Camellia Candle M.D.   On: 05/21/2024 07:00     Procedures   Medications Ordered in the ED  morphine (PF) 4 MG/ML injection 4 mg (4 mg Intravenous Given 05/21/24 0656)  ondansetron (ZOFRAN) injection 4 mg (4 mg Intravenous Given 05/21/24 0656)                                    Medical Decision Making Amount and/or Complexity of Data Reviewed Labs: ordered. Radiology: ordered.  Risk Prescription drug management.   BP (!) 151/106 (BP Location: Right Arm)   Pulse 77   Temp (!) 97.5 F (36.4 C) (Oral)   Resp 16   SpO2 99%   77:54 AM 28 year old male history of asthma, presenting with complaint of abdominal pain.  Patient report he was awoke this morning with intense pain to the right side of his abdomen.  Pain is  sharp shooting, radiates towards his right testicle.  States he felt nauseous and did vomit once.  Currently rates pain a 6 out of 10.  He tries to urinate but noticed no improvement.  Does not endorse any fever or chills no diarrhea constipation no blood in the urine.  Denies any recent heavy lifting or strength activities.  Still has an intact appendix.  No prior history of kidney stone.  On exam, patient is resting comfortably in no acute discomfort.  He does not have any CVA tenderness.  He has some mild right lower quadrant tenderness without any guarding or rebound tenderness.  Negative Murphy sign and no pain at McBurney's point.  Genital exam with chaperone present showing no concerning skin changes, no hernia, no scrotal or testicular tenderness.  -Labs ordered, independently viewed and interpreted by me.  Labs remarkable for mild elevated white count of 12.2.  Urinalysis shows moderate hemoglobin and urine  testing suggestive of potential kidney stone. -The patient was maintained on a cardiac monitor.  I personally viewed and interpreted the cardiac monitored which showed an underlying rhythm of: Sinus rhythm -Imaging independently viewed and interpreted by me and I agree with radiologist's interpretation.  Result remarkable for CT scan of the abdomen pelvis demonstrate a 1 mm right UVJ nephrolithiasis.  This finding is consistent with patient presentation.  Appendix is normal. -This patient presents to the ED for concern of abdominal pain, this involves an extensive number of treatment options, and is a complaint that carries with it a high risk of complications and morbidity.  The differential diagnosis includes kidney stone, appendicitis, muscle strain, hernia, shingles -Co morbidities that complicate the patient evaluation includes none -Treatment includes morphine and Zofran -Reevaluation of the patient after these medicines showed that the patient improved -PCP office notes or outside notes reviewed -escalation to admission/observation considered: patients feels much better, is comfortable with discharge, and will follow up with PCP -Prescription medication considered, patient comfortable with over-the-counter Tylenol  ibuprofen  -Social Determinant of Health considered      Final diagnoses:  Kidney stone on right side    ED Discharge Orders     None          Nivia Colon, PA-C 05/21/24 0717    Mannie Pac T, DO 05/28/24 2302

## 2024-05-21 NOTE — ED Triage Notes (Signed)
 Patient reports low abdominal pain this evening , no emesis or diarrhea .

## 2024-05-21 NOTE — Discharge Instructions (Signed)
 You have been evaluated for your symptoms.  CT scan demonstrated a 1 mm kidney stone on the right side causing the discomfort.  You are likely able to pass a stone within the next 1 to 2 days.  You may take over-the-counter Tylenol  or ibuprofen  as needed for pain.  There are another kidney stone in your right kidney not causing any problem at this time.

## 2024-07-26 ENCOUNTER — Other Ambulatory Visit (HOSPITAL_BASED_OUTPATIENT_CLINIC_OR_DEPARTMENT_OTHER): Payer: Self-pay

## 2024-07-26 MED ORDER — OSELTAMIVIR PHOSPHATE 75 MG PO CAPS
ORAL_CAPSULE | ORAL | 0 refills | Status: AC
  Filled 2024-07-26: qty 10, 10d supply, fill #0
